# Patient Record
Sex: Female | Born: 1970 | Race: White | Hispanic: No | Marital: Married | Smoking: Current every day smoker
Health system: Southern US, Community
[De-identification: ages and names within clinical notes are randomized; demographics above are authoritative.]

## PROBLEM LIST (undated history)

## (undated) DIAGNOSIS — G4730 Sleep apnea, unspecified: Secondary | ICD-10-CM

## (undated) DIAGNOSIS — G8929 Other chronic pain: Secondary | ICD-10-CM

## (undated) DIAGNOSIS — F319 Bipolar disorder, unspecified: Secondary | ICD-10-CM

## (undated) DIAGNOSIS — M545 Low back pain: Secondary | ICD-10-CM

## (undated) HISTORY — DX: Bipolar disorder, unspecified: F31.9

## (undated) HISTORY — PX: TUBAL LIGATION: SHX77

## (undated) HISTORY — DX: Sleep apnea, unspecified: G47.30

## (undated) HISTORY — DX: Low back pain: M54.5

## (undated) HISTORY — PX: OTHER SURGICAL HISTORY: SHX169

## (undated) HISTORY — PX: WISDOM TOOTH EXTRACTION: SHX21

## (undated) HISTORY — DX: Other chronic pain: G89.29

---

## 2014-03-23 ENCOUNTER — Ambulatory Visit (INDEPENDENT_AMBULATORY_CARE_PROVIDER_SITE_OTHER): Payer: 59 | Admitting: Family Medicine

## 2014-03-23 ENCOUNTER — Encounter: Payer: Self-pay | Admitting: Family Medicine

## 2014-03-23 VITALS — BP 135/91 | HR 89 | Ht 66.0 in | Wt 178.0 lb

## 2014-03-23 DIAGNOSIS — Z1239 Encounter for other screening for malignant neoplasm of breast: Secondary | ICD-10-CM

## 2014-03-23 DIAGNOSIS — Z803 Family history of malignant neoplasm of breast: Secondary | ICD-10-CM

## 2014-03-23 DIAGNOSIS — F329 Major depressive disorder, single episode, unspecified: Secondary | ICD-10-CM

## 2014-03-23 DIAGNOSIS — F319 Bipolar disorder, unspecified: Secondary | ICD-10-CM

## 2014-03-23 DIAGNOSIS — Z131 Encounter for screening for diabetes mellitus: Secondary | ICD-10-CM

## 2014-03-23 DIAGNOSIS — F3289 Other specified depressive episodes: Secondary | ICD-10-CM

## 2014-03-23 DIAGNOSIS — F32A Depression, unspecified: Secondary | ICD-10-CM | POA: Insufficient documentation

## 2014-03-23 HISTORY — DX: Bipolar disorder, unspecified: F31.9

## 2014-03-23 MED ORDER — ESCITALOPRAM OXALATE 10 MG PO TABS: 10.0000 mg | ORAL_TABLET | Freq: Every day | ORAL | Status: DC

## 2014-03-23 MED ORDER — QUETIAPINE FUMARATE ER 200 MG PO TB24: 200.0000 mg | ORAL_TABLET | Freq: Every day | ORAL | Status: DC

## 2014-03-23 NOTE — Progress Notes (Signed)
CC: Aimee Garza is a 43 y.o. female is here for Establish Care   Subjective: HPI:  Pleasant 43 year old here to establish care  Patient reports a history of depression that has been present for almost a decade. She has been on Lexapro for 7 years and states this is been beneficial for depressive symptoms described as irritability, decreased interest in all hobbies, and subjective poor outlook on life. There has been no history of going to harm self or others. Seroquel was added to her regimen 3 years ago and she believes that it was quite helpful at the time however she's feeling new subjective feelings of depression ever since moving here one month ago.  She states that symptoms are mild right now however she wants to know if going up on Seroquel would be of any benefit.  She admits that she does have a history of poor decision making when it comes to finances, before starting Seroquel she would spend money on books and coffee without any hesitation despite accumulating thousands of dollars credit card debt.  She denies any other manic activity.  She reports that both her mother and her grandmother has been diagnosed with breast cancer. Patient has never had a mammogram. She denies any breast complaints or architectural changes in the chest.   Review of Systems - General ROS: negative for - chills, fever, night sweats, weight gain or weight loss Ophthalmic ROS: negative for - decreased vision Psychological ROS: negative for - anxiety or depression other than that described above ENT ROS: negative for - hearing change, nasal congestion, tinnitus or allergies Hematological and Lymphatic ROS: negative for - bleeding problems, bruising or swollen lymph nodes Breast ROS: negative Respiratory ROS: no cough, shortness of breath, or wheezing Cardiovascular ROS: no chest pain or dyspnea on exertion Gastrointestinal ROS: no abdominal pain, change in bowel habits, or black or bloody  stools Genito-Urinary ROS: negative for - genital discharge, genital ulcers, incontinence or abnormal bleeding from genitals Musculoskeletal ROS: negative for - joint pain or muscle pain Neurological ROS: negative for - headaches or memory loss Dermatological ROS: negative for lumps, mole changes, rash and skin lesion changes  Past Medical History  Diagnosis Date  . Bipolar disorder 03/23/2014    History reviewed. No pertinent past surgical history. Family History  Problem Relation Age of Onset  . Alcoholism      grandfather  . Breast cancer Mother   . Diabetes      grandmother  . Hypertension      grandparents    History   Social History  . Marital Status: Married    Spouse Name: N/A    Number of Children: N/A  . Years of Education: N/A   Occupational History  . Not on file.   Social History Main Topics  . Smoking status: Current Every Day Smoker -- 1.00 packs/day for 26 years    Types: Cigarettes  . Smokeless tobacco: Not on file  . Alcohol Use: Yes  . Drug Use: No  . Sexual Activity: Not Currently    Partners: Male   Other Topics Concern  . Not on file   Social History Narrative  . No narrative on file     Objective: BP 135/91  Pulse 89  Ht 5\' 6"  (1.676 m)  Wt 178 lb (80.74 kg)  BMI 28.74 kg/m2  General: Alert and Oriented, No Acute Distress HEENT: Pupils equal, round, reactive to light. Conjunctivae clear.  Moist membranes pharynx unremarkable Lungs: Clear to auscultation bilaterally,  no wheezing/ronchi/rales.  Comfortable work of breathing. Good air movement. Cardiac: Regular rate and rhythm. Normal S1/S2.  No murmurs, rubs, nor gallops.   Extremities: No peripheral edema.  Strong peripheral pulses.  Mental Status: No depression, anxiety, nor agitation. Skin: Warm and dry.  Assessment & Plan: [name] was seen today for establish care.  Diagnoses and associated orders for this visit:  Depression - Discontinue: QUEtiapine (SEROQUEL XR) 200 MG 24  hr tablet; Take 1 tablet (200 mg total) by mouth at bedtime. - TSH - escitalopram (LEXAPRO) 10 MG tablet; Take 1 tablet (10 mg total) by mouth daily. - QUEtiapine (SEROQUEL XR) 200 MG 24 hr tablet; Take 1 tablet (200 mg total) by mouth at bedtime.  Screening for breast cancer - MM DIGITAL SCREENING BILATERAL; Future  Diabetes mellitus screening - COMPLETE METABOLIC PANEL WITH GFR  Bipolar disorder  Family history of breast cancer    Depression: Uncontrolled, Since she has shown some bipolar symptoms in the past will increase Seroquel first, start 200 mg daily if no improvement after one month will go back to 150 and titrate up Lexapro. Checking thyroid function She has already had a lipid screening this year and tells me that it was normal, I've asked her to drop off her values for my records at her convenience. Mammogram has been ordered for family history of breast cancer in this patient over the age of 59 Due for routine diabetic screening    Return in about 4 weeks (around 04/20/2014) for CPE.

## 2014-03-24 ENCOUNTER — Ambulatory Visit (INDEPENDENT_AMBULATORY_CARE_PROVIDER_SITE_OTHER): Payer: 59

## 2014-03-24 DIAGNOSIS — Z1239 Encounter for other screening for malignant neoplasm of breast: Secondary | ICD-10-CM

## 2014-03-24 DIAGNOSIS — Z1231 Encounter for screening mammogram for malignant neoplasm of breast: Secondary | ICD-10-CM

## 2014-03-24 LAB — COMPLETE METABOLIC PANEL WITH GFR
ALK PHOS: 70 U/L (ref 39–117)
ALT: 8 U/L (ref 0–35)
AST: 16 U/L (ref 0–37)
Albumin: 4.6 g/dL (ref 3.5–5.2)
BUN: 8 mg/dL (ref 6–23)
CALCIUM: 10.1 mg/dL (ref 8.4–10.5)
CHLORIDE: 99 meq/L (ref 96–112)
CO2: 28 mEq/L (ref 19–32)
Creat: 0.73 mg/dL (ref 0.50–1.10)
GFR, Est African American: >89 mL/min
GFR, Est Non African American: >89 mL/min
Glucose, Bld: 85 mg/dL (ref 70–99)
POTASSIUM: 4.2 meq/L (ref 3.5–5.3)
Sodium: 135 mEq/L (ref 135–145)
Total Bilirubin: 0.5 mg/dL (ref 0.2–1.2)
Total Protein: 6.9 g/dL (ref 6.0–8.3)

## 2014-03-24 LAB — TSH: TSH: 1.258 u[IU]/mL (ref 0.350–4.500)

## 2014-04-02 ENCOUNTER — Encounter: Payer: Self-pay | Admitting: Family Medicine

## 2014-04-14 ENCOUNTER — Other Ambulatory Visit (HOSPITAL_COMMUNITY)
Admission: RE | Admit: 2014-04-14 | Discharge: 2014-04-14 | Disposition: A | Discharge status: Home / Self Care | Payer: 59 | Source: Ambulatory Visit | Attending: Family Medicine | Admitting: Family Medicine

## 2014-04-14 ENCOUNTER — Encounter: Payer: Self-pay | Admitting: Family Medicine

## 2014-04-14 ENCOUNTER — Ambulatory Visit (INDEPENDENT_AMBULATORY_CARE_PROVIDER_SITE_OTHER): Payer: 59 | Admitting: Family Medicine

## 2014-04-14 VITALS — BP 118/86 | HR 84 | Ht 66.0 in | Wt 178.0 lb

## 2014-04-14 DIAGNOSIS — Z0000 Encounter for general adult medical examination without abnormal findings: Secondary | ICD-10-CM

## 2014-04-14 DIAGNOSIS — D239 Other benign neoplasm of skin, unspecified: Secondary | ICD-10-CM

## 2014-04-14 DIAGNOSIS — Z01419 Encounter for gynecological examination (general) (routine) without abnormal findings: Secondary | ICD-10-CM | POA: Insufficient documentation

## 2014-04-14 DIAGNOSIS — Z113 Encounter for screening for infections with a predominantly sexual mode of transmission: Secondary | ICD-10-CM | POA: Insufficient documentation

## 2014-04-14 DIAGNOSIS — Z124 Encounter for screening for malignant neoplasm of cervix: Secondary | ICD-10-CM

## 2014-04-14 DIAGNOSIS — Z1151 Encounter for screening for human papillomavirus (HPV): Secondary | ICD-10-CM | POA: Insufficient documentation

## 2014-04-14 NOTE — Progress Notes (Signed)
CC: Aimee Garza is a 43 y.o. female is here for Annual Exam   Subjective: HPI:  Colonoscopy: No current indication Papsmear: She believes it has been greater than 5 years and has no history of abnormals Mammogram: Unremarkable last month no history of abnormal  Influenza Vaccine: No current indication Pneumovax: No current indication Td/Tdap: Td given 6 years ago Zoster: (Start 43 yo)  No no acute complaints. No tobacco use, rare alcohol use, no recreational drug use  Review of Systems - General ROS: negative for - chills, fever, night sweats, weight gain or weight loss Ophthalmic ROS: negative for - decreased vision Psychological ROS: negative for - anxiety or depression ENT ROS: negative for - hearing change, nasal congestion, tinnitus or allergies Hematological and Lymphatic ROS: negative for - bleeding problems, bruising or swollen lymph nodes Breast ROS: negative Respiratory ROS: no cough, shortness of breath, or wheezing Cardiovascular ROS: no chest pain or dyspnea on exertion Gastrointestinal ROS: no abdominal pain, change in bowel habits, or black or bloody stools Genito-Urinary ROS: negative for - genital discharge, genital ulcers, incontinence or abnormal bleeding from genitals Musculoskeletal ROS: negative for - joint pain or muscle pain Neurological ROS: negative for - headaches or memory loss Dermatological ROS: negative for lumps, mole changes, rash and skin lesion changes  Past Medical History  Diagnosis Date  . Bipolar disorder 03/23/2014    No past surgical history on file. Family History  Problem Relation Age of Onset  . Alcoholism      grandfather  . Breast cancer Mother   . Diabetes      grandmother  . Hypertension      grandparents    History   Social History  . Marital Status: Married    Spouse Name: N/A    Number of Children: N/A  . Years of Education: N/A   Occupational History  . Not on file.   Social History Main Topics  . Smoking  status: Current Every Day Smoker -- 1.00 packs/day for 26 years    Types: Cigarettes  . Smokeless tobacco: Not on file  . Alcohol Use: Yes  . Drug Use: No  . Sexual Activity: Not Currently    Partners: Male   Other Topics Concern  . Not on file   Social History Narrative  . No narrative on file     Objective: BP 118/86  Pulse 84  Ht 5\' 6"  (1.676 m)  Wt 178 lb (80.74 kg)  BMI 28.74 kg/m2  LMP 02/25/2014  General: No Acute Distress HEENT: Atraumatic, normocephalic, conjunctivae normal without scleral icterus.  No nasal discharge, hearing grossly intact, TMs with good landmarks bilaterally with no middle ear abnormalities, posterior pharynx clear without oral lesions. Neck: Supple, trachea midline, no cervical nor supraclavicular adenopathy. Pulmonary: Clear to auscultation bilaterally without wheezing, rhonchi, nor rales. Cardiac: Regular rate and rhythm.  No murmurs, rubs, nor gallops. No peripheral edema.  2+ peripheral pulses bilaterally. Abdomen: Bowel sounds normal.  No masses.  Non-tender without rebound.  Negative Murphy's sign. GU:  Labia majora & minora without lesions.  Distal urethra unremarkable.  Vaginal wall integrity preserved without mucosal lesions.  Cervix non-tender and without gross lesions nor discharge. MSK: Grossly intact, no signs of weakness.  Full strength throughout upper and lower extremities.  Full ROM in upper and lower extremities.  No midline spinal tenderness. Neuro: Gait unremarkable, CN II-XII grossly intact.  C5-C6 Reflex 2/4 Bilaterally, L4 Reflex 2/4 Bilaterally.  Cerebellar function intact. Skin: No rashes. Dermatofibroma right  thigh medially Psych: Alert and oriented to person/place/time.  Thought process normal. No anxiety/depression.  Assessment & Plan: Dori was seen today for annual exam.  Diagnoses and associated orders for this visit:  Screening for cervical cancer - Cytology - PAP  Annual physical exam - Cancel: Cytology -  PAP  Dermatofibroma    Healthy lifestyle interventions including but not limited to regular exercise, a healthy low fat diet, moderation of salt intake, the dangers of tobacco/alcohol/recreational drug use, nutrition supplementation, and accident avoidance were discussed with the patient and a handout was provided for future reference. Pap smear was obtained and she prefer to have gonorrhea and Chlamydia testing added Discussed benign nature of dermatofibroma  Return in about 6 months (around 10/15/2014) for Mood Follow Up.

## 2014-04-14 NOTE — Patient Instructions (Signed)
"Dermatofibroma" on the right thigh.  Benign skin growth.  Dr. Lajoyce Lauber General Advice Following Your Complete Physical Exam  The Benefits of Regular Exercise: Unless you suffer from an uncontrolled cardiovascular condition, studies strongly suggest that regular exercise and physical activity will add to both the quality and length of your life.  The World Health Organization recommends 150 minutes of moderate intensity aerobic activity every week.  This is best split over 3-4 days a week, and can be as simple as a brisk walk for just over 35 minutes "most days of the week".  This type of exercise has been shown to lower LDL-Cholesterol, lower average blood sugars, lower blood pressure, lower cardiovascular disease risk, improve memory, and increase one's overall sense of wellbeing.  The addition of anaerobic (or "strength training") exercises offers additional benefits including but not limited to increased metabolism, prevention of osteoporosis, and improved overall cholesterol levels.  How Can I Strive For A Low-Fat Diet?: Current guidelines recommend that 25-35 percent of your daily energy (food) intake should come from fats.  One might ask how can this be achieved without having to dissect each meal on a daily basis?  Switch to skim or 1% milk instead of whole milk.  Focus on lean meats such as ground [place], fresh fish, baked chicken, and lean cuts of beef as your source of dietary protein.  Limit saturated fat consumption to less than 10% of your daily caloric intake.  Limit trans fatty acid consumption primarily by limiting synthetic trans fats such as partially hydrogenated oils (Ex: fried fast foods).  Substitute olive or vegetable oil for solid fats where possible.  Moderation of Salt Intake: Provided you don't carry a diagnosis of congestive heart failure nor renal failure, I recommend a daily allowance of no more than 2300 mg of salt (sodium).  Keeping under this daily goal is  associated with a decreased risk of cardiovascular events, creeping above it can lead to elevated blood pressures and increases your risk of cardiovascular events.  Milligrams (mg) of salt is listed on all nutrition labels, and your daily intake can add up faster than you think.  Most canned and frozen dinners can pack in over half your daily salt allowance in one meal.    Lifestyle Health Risks: Certain lifestyle choices carry specific health risks.  As you may already know, tobacco use has been associated with increasing one's risk of cardiovascular disease, pulmonary disease, numerous cancers, among many other issues.  What you may not know is that there are medications and nicotine replacement strategies that can more than double your chances of successfully quitting.  I would be thrilled to help manage your quitting strategy if you currently use tobacco products.  When it comes to alcohol use, I've yet to find an "ideal" daily allowance.  Provided an individual does not have a medical condition that is exacerbated by alcohol consumption, general guidelines determine "safe drinking" as no more than two standard drinks for a man or no more than one standard drink for a female per day.  However, much debate still exists on whether any amount of alcohol consumption is technically "safe".  My general advice, keep alcohol consumption to a minimum for general health promotion.  If you or others believe that alcohol, tobacco, or recreational drug use is interfering with your life, I would be happy to provide confidential counseling regarding treatment options.  General "Over The Counter" Nutrition Advice: Postmenopausal women should aim for a daily calcium intake of 1200  mg, however a significant portion of this might already be provided by diets including milk, yogurt, cheese, and other dairy products.  Vitamin D has been shown to help preserve bone density, prevent fatigue, and has even been shown to help reduce  falls in the elderly.  Ensuring a daily intake of 800 Units of Vitamin D is a good place to start to enjoy the above benefits, we can easily check your Vitamin D level to see if you'd potentially benefit from supplementation beyond 800 Units a day.  Folic Acid intake should be of particular concern to women of childbearing age.  Daily consumption of 025-427 mcg of Folic Acid is recommended to minimize the chance of spinal cord defects in a fetus should pregnancy occur.    For many adults, accidents still remain one of the most common culprits when it comes to cause of death.  Some of the simplest but most effective preventitive habits you can adopt include regular seatbelt use, proper helmet use, securing firearms, and regularly testing your smoke and carbon monoxide detectors.  Rani Idler B. [facility] [place], [place], [place] [address] Phone: [phone]

## 2014-08-01 ENCOUNTER — Other Ambulatory Visit: Payer: Self-pay | Admitting: Family Medicine

## 2014-08-02 NOTE — Telephone Encounter (Signed)
Left message asking patient to confirm the dose of the Seroquel. Is she taking 200 mg's or 150 mg's.

## 2014-08-27 ENCOUNTER — Other Ambulatory Visit: Payer: Self-pay | Admitting: *Deleted

## 2014-08-27 ENCOUNTER — Other Ambulatory Visit: Payer: Self-pay

## 2014-08-27 DIAGNOSIS — F32A Depression, unspecified: Secondary | ICD-10-CM

## 2014-08-27 DIAGNOSIS — F329 Major depressive disorder, single episode, unspecified: Secondary | ICD-10-CM

## 2014-08-27 MED ORDER — ESCITALOPRAM OXALATE 10 MG PO TABS: 10.0000 mg | ORAL_TABLET | Freq: Every day | ORAL | Status: DC

## 2014-08-27 MED ORDER — QUETIAPINE FUMARATE ER 200 MG PO TB24: 200.0000 mg | ORAL_TABLET | Freq: Every day | ORAL | Status: DC

## 2014-09-28 ENCOUNTER — Other Ambulatory Visit: Payer: Self-pay | Admitting: Family Medicine

## 2014-10-23 ENCOUNTER — Other Ambulatory Visit: Payer: Self-pay | Admitting: Family Medicine

## 2014-10-25 ENCOUNTER — Other Ambulatory Visit: Payer: Self-pay | Admitting: Family Medicine

## 2014-11-11 ENCOUNTER — Other Ambulatory Visit: Payer: Self-pay | Admitting: Family Medicine

## 2014-11-25 ENCOUNTER — Other Ambulatory Visit: Payer: Self-pay | Admitting: Family Medicine

## 2014-12-07 ENCOUNTER — Ambulatory Visit (INDEPENDENT_AMBULATORY_CARE_PROVIDER_SITE_OTHER): Payer: 59 | Admitting: Family Medicine

## 2014-12-07 ENCOUNTER — Encounter: Payer: Self-pay | Admitting: Family Medicine

## 2014-12-07 VITALS — BP 123/78 | HR 83 | Wt 184.0 lb

## 2014-12-07 DIAGNOSIS — N946 Dysmenorrhea, unspecified: Secondary | ICD-10-CM

## 2014-12-07 DIAGNOSIS — F329 Major depressive disorder, single episode, unspecified: Secondary | ICD-10-CM

## 2014-12-07 DIAGNOSIS — M545 Low back pain, unspecified: Secondary | ICD-10-CM

## 2014-12-07 DIAGNOSIS — F32A Depression, unspecified: Secondary | ICD-10-CM

## 2014-12-07 DIAGNOSIS — F3176 Bipolar disorder, in full remission, most recent episode depressed: Secondary | ICD-10-CM

## 2014-12-07 MED ORDER — CYCLOBENZAPRINE HCL 10 MG PO TABS: ORAL_TABLET | ORAL | Status: DC

## 2014-12-07 MED ORDER — QUETIAPINE FUMARATE ER 200 MG PO TB24: ORAL_TABLET | ORAL | Status: DC

## 2014-12-07 MED ORDER — ESCITALOPRAM OXALATE 10 MG PO TABS: ORAL_TABLET | ORAL | Status: DC

## 2014-12-07 NOTE — Progress Notes (Addendum)
CC: Aimee Garza is a 44 y.o. female is here for depression f/u   Subjective: HPI:  Follow-up depression: Continues to take Lexapro 10 mg daily, she ran out of Seroquel last week. Since I saw her last she denies any depression but has experienced irritability since running out of Seroquel. She denies any known side effects. She denies any other mental disturbance nor thoughts of wanting to harm herself or others. She denies anxiety, hallucinations, paranoia, impulsivity or any manic-like behavior.  Complains of right low back pain that has been present for the past 3 weeks on a daily basis. 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 It is described as mild in severity. Worse with certain twisting movements or side bending to the left. It is nonradiating. Slightly improved with muscle relaxers no benefit from ibuprofen or Tylenol.  She wants know she should have her estrogen levels checked. She has this because she wants know how close she is to menopause. Upon further questioning she tells me that her menstrual cycles are predictable however the last 7 days and are described as severe in severity with respect to pain. She's been wanting to know if she should get a hysterectomy.   Review Of Systems Outlined In HPI  Past Medical History  Diagnosis Date  . Bipolar disorder 03/23/2014    No past surgical history on file. Family History  Problem Relation Age of Onset  . Alcoholism      grandfather  . Breast cancer Mother   . Diabetes      grandmother  . Hypertension      grandparents    History   Social History  . Marital Status: Married    Spouse Name: N/A    Number of Children: N/A  . Years of Education: N/A   Occupational History  . Not on file.   Social History Main Topics  . Smoking status: Current Every Day Smoker -- 1.00 packs/day for 26 years    Types: Cigarettes  . Smokeless tobacco: Not on file  . Alcohol Use: Yes  . Drug Use: No  . Sexual  Activity:    Partners: Male   Other Topics Concern  . Not on file   Social History Narrative     Objective: BP 123/78 mmHg  Pulse 83  Wt 184 lb (83.462 kg)  Vital signs reviewed. General: Alert and Oriented, No Acute Distress HEENT: Pupils equal, round, reactive to light. Conjunctivae clear.  External ears unremarkable.  Moist mucous membranes. Lungs: Clear and comfortable work of breathing, speaking in full sentences without accessory muscle use. Cardiac: Regular rate and rhythm.  Back: No midline spinous process tenderness in the lumbar region. Pain is reproduced with paraspinal musculature palpation on the right, no palpable mass. Full range of motion and strength in both lower extremities Neuro: CN II-XII grossly intact, gait normal. Extremities: No peripheral edema.  Strong peripheral pulses.  Mental Status: No depression, anxiety, nor agitation. Logical though process. Skin: Warm and dry.  Assessment & Plan: [name] was seen today for depression f/u.  Diagnoses and associated orders for this visit:  Bipolar disorder, in full remission, most recent episode depressed - QUEtiapine (SEROQUEL XR) 200 MG 24 hr tablet; TAKE 1 TABLET BY MOUTH AT BEDTIME. - escitalopram (LEXAPRO) 10 MG tablet; TAKE 1 TABLET (10 MG TOTAL) BY MOUTH DAILY.  Depression - QUEtiapine (SEROQUEL XR) 200 MG 24 hr tablet; TAKE 1 TABLET BY MOUTH AT BEDTIME. - escitalopram (LEXAPRO) 10 MG tablet;  TAKE 1 TABLET (10 MG TOTAL) BY MOUTH DAILY.  Dysmenorrhea - Ambulatory referral to Gynecology  Right-sided low back pain without sciatica - cyclobenzaprine (FLEXERIL) 10 MG tablet; Take a half to a full tab every 8-12 hours only as needed for muscle spasm or back pain, may cause sedation.    Bipolar disorder with depression: Currently controlled, irritability is only due to running out of Seroquel. Continue former regimen of Seroquel and Lexapro. She was given a handout of rehabilitative exercises and  stretches to do at home to help with her muscle strain causing her back pain. Flexeril as needed for discomfort. Referral to gynecology has been placed to look into her candidacy for hysterectomy, she tells me she is not interested in any hormonal approaches to controlling her dysmenorrhea   Return in about 6 months (around 06/07/2015).

## 2014-12-10 ENCOUNTER — Other Ambulatory Visit: Payer: Self-pay | Admitting: Family Medicine

## 2014-12-14 ENCOUNTER — Telehealth: Payer: Self-pay

## 2014-12-14 NOTE — Telephone Encounter (Signed)
Left message for pt to call office to schedule appt. Received referral from Dr. Ileene Rubens.

## 2015-03-01 ENCOUNTER — Ambulatory Visit (INDEPENDENT_AMBULATORY_CARE_PROVIDER_SITE_OTHER): Payer: 59 | Admitting: Family Medicine

## 2015-03-01 ENCOUNTER — Encounter: Payer: Self-pay | Admitting: Family Medicine

## 2015-03-01 VITALS — BP 121/84 | HR 82 | Wt 184.0 lb

## 2015-03-01 DIAGNOSIS — R609 Edema, unspecified: Secondary | ICD-10-CM | POA: Diagnosis not present

## 2015-03-01 DIAGNOSIS — Z13220 Encounter for screening for lipoid disorders: Secondary | ICD-10-CM | POA: Diagnosis not present

## 2015-03-01 DIAGNOSIS — J329 Chronic sinusitis, unspecified: Secondary | ICD-10-CM

## 2015-03-01 DIAGNOSIS — M5416 Radiculopathy, lumbar region: Secondary | ICD-10-CM | POA: Diagnosis not present

## 2015-03-01 DIAGNOSIS — B9689 Other specified bacterial agents as the cause of diseases classified elsewhere: Secondary | ICD-10-CM

## 2015-03-01 DIAGNOSIS — A499 Bacterial infection, unspecified: Secondary | ICD-10-CM

## 2015-03-01 LAB — COMPLETE METABOLIC PANEL WITH GFR
ALK PHOS: 73 U/L (ref 39–117)
ALT: 11 U/L (ref 0–35)
AST: 17 U/L (ref 0–37)
Albumin: 4.1 g/dL (ref 3.5–5.2)
BILIRUBIN TOTAL: 0.2 mg/dL (ref 0.2–1.2)
BUN: 12 mg/dL (ref 6–23)
CHLORIDE: 106 meq/L (ref 96–112)
CO2: 23 mEq/L (ref 19–32)
Calcium: 8.7 mg/dL (ref 8.4–10.5)
Creat: 0.75 mg/dL (ref 0.50–1.10)
GFR, Est African American: >89 mL/min
Glucose, Bld: 82 mg/dL (ref 70–99)
POTASSIUM: 4.8 meq/L (ref 3.5–5.3)
SODIUM: 138 meq/L (ref 135–145)
Total Protein: 6.3 g/dL (ref 6.0–8.3)

## 2015-03-01 LAB — LIPID PANEL
Cholesterol: 172 mg/dL (ref 0–200)
HDL: 56 mg/dL (ref >=46)
LDL Cholesterol: 97 mg/dL (ref 0–99)
Total CHOL/HDL Ratio: 3.1 Ratio
Triglycerides: 96 mg/dL (ref <150)
VLDL: 19 mg/dL (ref 0–40)

## 2015-03-01 LAB — TSH: TSH: 1.062 u[IU]/mL (ref 0.350–4.500)

## 2015-03-01 MED ORDER — TRAMADOL HCL 50 MG PO TABS: 50.0000 mg | ORAL_TABLET | Freq: Three times a day (TID) | ORAL | Status: DC | PRN

## 2015-03-01 MED ORDER — PREDNISONE 20 MG PO TABS: ORAL_TABLET | ORAL | Status: AC

## 2015-03-01 NOTE — Progress Notes (Signed)
CC: Aimee Garza is a 44 y.o. female is here for Back Pain   Subjective: HPI:  Complains of persistent right low back pain that radiates into the anterior right thigh symptoms are mild-to-moderate in severity. Improved with mobility and muscle relaxers to mild degree, also has benefited one dose of Percocet however only for a few hours. Denies any other motor or sensory disturbances in the lower back or lower extremities. 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 Pain slightly improved with bending forward. Slightly worse with bending backwards.  No gastrointestinal orGenitourinary complaints  Complains of facial pressure localized to the forehead with nasal congestion and postnasal drip. Nonproductive cough. Fatigue that all has been present for the past 7 days. Those are moderate in severity and persistent. Nothing seems to make it better or worse. No interventions as of yet. No fevers, chills, sore throat, shortness of breath or bloody sputum.  Complains of bilateral swelling of the ankles that has been present for the past few months. Off-and-on mild in severity. Nothing seems to make it better or worse. She denies any orthopnea nor edema else she admits that she enjoys salty foods and drinks and does not moderate her sodium intake.     Review Of Systems Outlined In HPI  Past Medical History  Diagnosis Date  . Bipolar disorder 03/23/2014    No past surgical history on file. Family History  Problem Relation Age of Onset  . Alcoholism      grandfather  . Breast cancer Mother   . Diabetes      grandmother  . Hypertension      grandparents    History   Social History  . Marital Status: Married    Spouse Name: N/A  . Number of Children: N/A  . Years of Education: N/A   Occupational History  . Not on file.   Social History Main Topics  . Smoking status: Current Every Day Smoker -- 1.00 packs/day for 26 years     Types: Cigarettes  . Smokeless tobacco: Not on file  . Alcohol Use: Yes  . Drug Use: No  . Sexual Activity:    Partners: Male   Other Topics Concern  . Not on file   Social History Narrative     Objective: BP 121/84 mmHg  Pulse 82  Wt 184 lb (83.462 kg)  General: Alert and Oriented, No Acute Distress HEENT: Pupils equal, round, reactive to light. Conjunctivae clear.  External ears unremarkable, canals clear with intact TMs with appropriate landmarks.  Middle ear appears open without effusion. Pink inferior turbinates.  Moist mucous membranes, pharynx without inflammation nor lesions.  Neck supple without palpable lymphadenopathy nor abnormal masses. Lungs: Clear to auscultation bilaterally, no wheezing/ronchi/rales.  Comfortable work of breathing. Good air movement. Back: No midline spinous process tenderness in the lumbar region to palpation. No paraspinal musculature tenderness to palpation. Full range of motion and strength in both lower extremities. Extremities: No peripheral edema.  Strong peripheral pulses.  Mental Status: No depression, anxiety, nor agitation. Skin: Warm and dry.no overlying skin changes of site of her discomfort  Assessment & Plan: Adream was seen today for back pain.  Diagnoses and all orders for this visit:  Lumbar radiculitis Orders: -     predniSONE (DELTASONE) 20 MG tablet; Three tabs daily days 1-3, two tabs daily days 4-6, one tab daily days 7-9, half tab daily days 10-13. -  traMADol (ULTRAM) 50 MG tablet; Take 1 tablet (50 mg total) by mouth every 8 (eight) hours as needed.  Bacterial sinusitis Orders: -     predniSONE (DELTASONE) 20 MG tablet; Three tabs daily days 1-3, two tabs daily days 4-6, one tab daily days 7-9, half tab daily days 10-13.  Edema Orders: -     COMPLETE METABOLIC PANEL WITH GFR -     TSH  Lipid screening Orders: -     Lipid panel   Suspicion of lumbar radiculitis therefore start prednisone as therapeutic  and diagnostic approach to her discomfort. Encouraged her not to use Percocet due to risk of tolerance and rather use tramadol instead. If no better in 1 week call me and we'll refer to Dr. Darene Lamer for second opinion on management Edema: Discussed this is most likely due to her sodium intake. She would like her kidney function checked which is reasonable. Also checking thyroid to rule out hypothyroidism.She would like a lipid panel checked since she's getting blood work done and has been over a year since this was checked last Bacterial sinusitis: Hopeful that her prednisone will relieve her from her sinusitis issues, if no better by Wednesday call me and I'll be happy to provide her with an antibiotic.  Return if symptoms worsen or fail to improve.

## 2015-03-04 ENCOUNTER — Other Ambulatory Visit: Payer: Self-pay | Admitting: Family Medicine

## 2015-03-04 DIAGNOSIS — F329 Major depressive disorder, single episode, unspecified: Secondary | ICD-10-CM

## 2015-03-04 DIAGNOSIS — F3176 Bipolar disorder, in full remission, most recent episode depressed: Secondary | ICD-10-CM

## 2015-03-04 DIAGNOSIS — F32A Depression, unspecified: Secondary | ICD-10-CM

## 2015-03-04 MED ORDER — QUETIAPINE FUMARATE ER 200 MG PO TB24: ORAL_TABLET | ORAL | Status: DC

## 2015-03-04 MED ORDER — ESCITALOPRAM OXALATE 10 MG PO TABS: ORAL_TABLET | ORAL | Status: DC

## 2015-06-05 ENCOUNTER — Other Ambulatory Visit: Payer: Self-pay | Admitting: Family Medicine

## 2015-06-11 ENCOUNTER — Ambulatory Visit: Payer: 59 | Admitting: Family Medicine

## 2015-06-15 ENCOUNTER — Encounter: Payer: Self-pay | Admitting: Family Medicine

## 2015-06-15 ENCOUNTER — Other Ambulatory Visit: Payer: Self-pay | Admitting: Family Medicine

## 2015-06-15 ENCOUNTER — Ambulatory Visit (INDEPENDENT_AMBULATORY_CARE_PROVIDER_SITE_OTHER): Payer: 59 | Admitting: Family Medicine

## 2015-06-15 VITALS — BP 132/93 | HR 95 | Ht 66.0 in | Wt 181.0 lb

## 2015-06-15 DIAGNOSIS — F3176 Bipolar disorder, in full remission, most recent episode depressed: Secondary | ICD-10-CM | POA: Diagnosis not present

## 2015-06-15 DIAGNOSIS — M545 Low back pain, unspecified: Secondary | ICD-10-CM

## 2015-06-15 DIAGNOSIS — F17200 Nicotine dependence, unspecified, uncomplicated: Secondary | ICD-10-CM

## 2015-06-15 DIAGNOSIS — F329 Major depressive disorder, single episode, unspecified: Secondary | ICD-10-CM

## 2015-06-15 DIAGNOSIS — H8113 Benign paroxysmal vertigo, bilateral: Secondary | ICD-10-CM

## 2015-06-15 DIAGNOSIS — Z720 Tobacco use: Secondary | ICD-10-CM

## 2015-06-15 DIAGNOSIS — M5416 Radiculopathy, lumbar region: Secondary | ICD-10-CM

## 2015-06-15 DIAGNOSIS — Z1231 Encounter for screening mammogram for malignant neoplasm of breast: Secondary | ICD-10-CM

## 2015-06-15 DIAGNOSIS — F32A Depression, unspecified: Secondary | ICD-10-CM

## 2015-06-15 MED ORDER — QUETIAPINE FUMARATE ER 200 MG PO TB24: ORAL_TABLET | ORAL | Status: DC

## 2015-06-15 MED ORDER — CYCLOBENZAPRINE HCL 10 MG PO TABS: ORAL_TABLET | ORAL | Status: DC

## 2015-06-15 MED ORDER — ESCITALOPRAM OXALATE 10 MG PO TABS
ORAL_TABLET | ORAL | Status: DC
Start: 2015-06-15 — End: 2015-12-05

## 2015-06-15 MED ORDER — VARENICLINE TARTRATE 1 MG PO TABS: 1.0000 mg | ORAL_TABLET | Freq: Two times a day (BID) | ORAL | Status: DC

## 2015-06-15 MED ORDER — TRAMADOL HCL 50 MG PO TABS
50.0000 mg | ORAL_TABLET | Freq: Three times a day (TID) | ORAL | Status: DC | PRN
Start: 2015-06-15 — End: 2015-07-20

## 2015-06-15 MED ORDER — VARENICLINE TARTRATE 0.5 MG X 11 & 1 MG X 42 PO MISC: ORAL | Status: DC

## 2015-06-15 NOTE — Progress Notes (Signed)
CC: Aimee Garza is a 44 y.o. female is here for Depression   Subjective: HPI:  Bipolar disorder: She is requesting refills on Seroquel and Lexapro. She states that she feels these medications are still doing a great job of preventing any depression or mood swings. She denies any known side effects.  She states that she is running out of tramadol and cyclobenzaprine that she usually uses for her right low back pain. The radicular component to her pain has resolved ever since she took prednisone when I saw her last. She still has what feels like a knot in the right lower back just posterior to the pelvic brim. It's nonradiating and worse with sitting for more than a few minutes. She denies any motor or sensory disturbances and lower extremities. She denies any new midline pain.   She tells me that she would like to try quitting smoking with Chantix. She had mild success with this in the past. She denies any known side effects will taking this medication. She's had trouble quitting cold [place] no other interventions as of yet.  She complains of dizziness that has been occurring on an almost daily that can occur anytime of the day in any random situation. It happened at rest, while driving, and while doing the dishes. It is described as a sudden sensation of dizziness that lasts a few seconds and subsides on its own without any intervention. She's never had this before. She denies any other motor or sensory disturbances. She denies headache, head trauma, nausea, vomiting nor visual disturbance. No changes to medications that she knows of recently. Denies orthostatic symptoms. Denies hearing loss or tinnitus   Review Of Systems Outlined In HPI  Past Medical History  Diagnosis Date  . Bipolar disorder 03/23/2014    No past surgical history on file. Family History  Problem Relation Age of Onset  . Alcoholism      grandfather  . Breast cancer Mother   . Diabetes      grandmother  . Hypertension       grandparents    History   Social History  . Marital Status: Married    Spouse Name: N/A  . Number of Children: N/A  . Years of Education: N/A   Occupational History  . Not on file.   Social History Main Topics  . Smoking status: Current Every Day Smoker -- 1.00 packs/day for 26 years    Types: Cigarettes  . Smokeless tobacco: Not on file  . Alcohol Use: Yes  . Drug Use: No  . Sexual Activity:    Partners: Male   Other Topics Concern  . Not on file   Social History Narrative     Objective: BP 132/93 mmHg  Pulse 95  Ht 5\' 6"  (1.676 m)  Wt 181 lb (82.101 kg)  BMI 29.23 kg/m2  General: Alert and Oriented, No Acute Distress HEENT: Pupils equal, round, reactive to light. Conjunctivae clear.  External ears unremarkable, canals clear with intact TMs with appropriate landmarks.  Middle ear appears open without effusion. Pink inferior turbinates.  Moist mucous membranes, pharynx without inflammation nor lesions.  Neck supple without palpable lymphadenopathy nor abnormal masses. Lungs: Clear to auscultation bilaterally, no wheezing/ronchi/rales.  Comfortable work of breathing. Good air movement. Cardiac: Regular rate and rhythm. Normal S1/S2.  No murmurs, rubs, nor gallops.   Neuro: Cranial nerves II through XII grossly intact Back: No midline spinous process tenderness in the lumbar region. Her pain is reproduced with palpation of the  paraspinal musculature on the right low lumbar region. Extremities: No peripheral edema.  Strong peripheral pulses.  Mental Status: No depression, anxiety, nor agitation. Skin: Warm and dry.  Assessment & Plan: Aimee Garza was seen today for depression.  Diagnoses and all orders for this visit:  Bipolar disorder, in full remission, most recent episode depressed Orders: -     escitalopram (LEXAPRO) 10 MG tablet; TAKE 1 TABLET (10 MG TOTAL) BY MOUTH DAILY. -     QUEtiapine (SEROQUEL XR) 200 MG 24 hr tablet; TAKE 1 TABLET BY MOUTH AT  BEDTIME.  Right-sided low back pain without sciatica Orders: -     cyclobenzaprine (FLEXERIL) 10 MG tablet; Take a half to a full tab every 8-12 hours only as needed for muscle spasm or back pain, may cause sedation.  Tobacco use disorder Orders: -     varenicline (CHANTIX STARTING MONTH PAK) 0.5 MG X 11 & 1 MG X 42 tablet; Take one 0.5 mg tablet by mouth once daily for 3 days, then increase to one 0.5 mg tablet twice daily for 4 days, then increase to one 1 mg tablet twice daily. -     varenicline (CHANTIX CONTINUING MONTH PAK) 1 MG tablet; Take 1 tablet (1 mg total) by mouth 2 (two) times daily.  Depression Orders: -     escitalopram (LEXAPRO) 10 MG tablet; TAKE 1 TABLET (10 MG TOTAL) BY MOUTH DAILY. -     QUEtiapine (SEROQUEL XR) 200 MG 24 hr tablet; TAKE 1 TABLET BY MOUTH AT BEDTIME.  Lumbar radiculitis Orders: -     traMADol (ULTRAM) 50 MG tablet; Take 1 tablet (50 mg total) by mouth every 8 (eight) hours as needed.  BPV (benign positional vertigo), bilateral   Bipolar disorder with depression: Controlled continue Seroquel and Lexapro.  Tobacco use disorder: Restart Chantix, discussed starting 1 week prior to a quit date Lumbar radiculitis: Radicular component is resolved, now just below back pain likely muscular strain, may continue cyclobenzaprine and as needed tramadol BPV: Discussed the modified Epley maneuver and how to do this at home 3 times a day for the next week and if no better will consider neuroimaging.  Return in about 6 months (around 12/16/2015).

## 2015-06-23 ENCOUNTER — Ambulatory Visit (INDEPENDENT_AMBULATORY_CARE_PROVIDER_SITE_OTHER): Payer: 59

## 2015-06-23 DIAGNOSIS — Z1231 Encounter for screening mammogram for malignant neoplasm of breast: Secondary | ICD-10-CM

## 2015-06-27 ENCOUNTER — Other Ambulatory Visit: Payer: Self-pay | Admitting: Family Medicine

## 2015-07-19 ENCOUNTER — Other Ambulatory Visit: Payer: Self-pay | Admitting: Family Medicine

## 2015-07-20 ENCOUNTER — Other Ambulatory Visit: Payer: Self-pay | Admitting: Family Medicine

## 2015-07-20 ENCOUNTER — Ambulatory Visit (INDEPENDENT_AMBULATORY_CARE_PROVIDER_SITE_OTHER): Payer: 59 | Admitting: Family Medicine

## 2015-07-20 ENCOUNTER — Encounter: Payer: Self-pay | Admitting: Family Medicine

## 2015-07-20 VITALS — BP 138/94 | HR 98 | Temp 98.4°F | Wt 182.0 lb

## 2015-07-20 DIAGNOSIS — M5416 Radiculopathy, lumbar region: Secondary | ICD-10-CM

## 2015-07-20 DIAGNOSIS — J00 Acute nasopharyngitis [common cold]: Secondary | ICD-10-CM | POA: Diagnosis not present

## 2015-07-20 DIAGNOSIS — M545 Low back pain, unspecified: Secondary | ICD-10-CM

## 2015-07-20 DIAGNOSIS — J208 Acute bronchitis due to other specified organisms: Principal | ICD-10-CM

## 2015-07-20 DIAGNOSIS — B9689 Other specified bacterial agents as the cause of diseases classified elsewhere: Secondary | ICD-10-CM

## 2015-07-20 MED ORDER — CYCLOBENZAPRINE HCL 10 MG PO TABS: ORAL_TABLET | ORAL | Status: DC

## 2015-07-20 MED ORDER — AZITHROMYCIN 250 MG PO TABS: ORAL_TABLET | ORAL | Status: AC

## 2015-07-20 MED ORDER — TRAMADOL HCL 50 MG PO TABS
50.0000 mg | ORAL_TABLET | Freq: Three times a day (TID) | ORAL | Status: DC | PRN
Start: 2015-07-20 — End: 2015-08-26
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
## 2015-08-24 ENCOUNTER — Other Ambulatory Visit: Payer: Self-pay | Admitting: Family Medicine

## 2015-08-25 ENCOUNTER — Other Ambulatory Visit: Payer: Self-pay | Admitting: Family Medicine

## 2015-08-25 DIAGNOSIS — M5416 Radiculopathy, lumbar region: Secondary | ICD-10-CM

## 2015-08-26 ENCOUNTER — Other Ambulatory Visit: Payer: Self-pay | Admitting: Family Medicine

## 2015-08-26 MED ORDER — TRAMADOL HCL 50 MG PO TABS: 50.0000 mg | ORAL_TABLET | Freq: Three times a day (TID) | ORAL | Status: DC | PRN

## 2015-09-18 ENCOUNTER — Other Ambulatory Visit: Payer: Self-pay | Admitting: Sports Medicine

## 2015-09-20 ENCOUNTER — Other Ambulatory Visit: Payer: Self-pay | Admitting: Sports Medicine

## 2015-09-20 DIAGNOSIS — M5416 Radiculopathy, lumbar region: Secondary | ICD-10-CM

## 2015-09-21 ENCOUNTER — Other Ambulatory Visit: Payer: Self-pay | Admitting: Sports Medicine

## 2015-09-21 MED ORDER — TRAMADOL HCL 50 MG PO TABS
50.0000 mg | ORAL_TABLET | Freq: Three times a day (TID) | ORAL | Status: DC | PRN
Start: 2015-09-21 — End: 2015-09-21

## 2015-09-21 NOTE — Telephone Encounter (Signed)
Aimee Garza, Rx placed in in-box ready for pickup/faxing.  

## 2015-11-22 ENCOUNTER — Other Ambulatory Visit: Payer: Self-pay | Admitting: Family Medicine

## 2015-12-04 ENCOUNTER — Other Ambulatory Visit: Payer: Self-pay | Admitting: Family Medicine

## 2015-12-05 ENCOUNTER — Other Ambulatory Visit: Payer: Self-pay | Admitting: Family Medicine

## 2015-12-15 ENCOUNTER — Ambulatory Visit: Payer: 59 | Admitting: Family Medicine

## 2015-12-21 ENCOUNTER — Ambulatory Visit (INDEPENDENT_AMBULATORY_CARE_PROVIDER_SITE_OTHER): Payer: 59 | Admitting: Family Medicine

## 2015-12-21 ENCOUNTER — Encounter: Payer: Self-pay | Admitting: Family Medicine

## 2015-12-21 ENCOUNTER — Ambulatory Visit (INDEPENDENT_AMBULATORY_CARE_PROVIDER_SITE_OTHER): Payer: 59

## 2015-12-21 VITALS — BP 123/88 | HR 93 | Wt 184.0 lb

## 2015-12-21 DIAGNOSIS — F3176 Bipolar disorder, in full remission, most recent episode depressed: Secondary | ICD-10-CM

## 2015-12-21 DIAGNOSIS — M545 Low back pain, unspecified: Secondary | ICD-10-CM

## 2015-12-21 DIAGNOSIS — Z23 Encounter for immunization: Secondary | ICD-10-CM | POA: Diagnosis not present

## 2015-12-21 MED ORDER — TOPIRAMATE 50 MG PO TABS: 50.0000 mg | ORAL_TABLET | Freq: Two times a day (BID) | ORAL | Status: DC

## 2015-12-21 MED ORDER — ESCITALOPRAM OXALATE 10 MG PO TABS: ORAL_TABLET | ORAL | Status: DC

## 2015-12-21 MED ORDER — QUETIAPINE FUMARATE ER 200 MG PO TB24: 200.0000 mg | ORAL_TABLET | Freq: Every day | ORAL | Status: DC

## 2015-12-21 NOTE — Progress Notes (Signed)
CC: Aimee Garza is a 45 y.o. female is here for Follow-up   Subjective: HPI:   follow-up bipolar disorder: She tells me she feels great,she's happy with her job and is enjoying life. There were 2 days when the seasons change from fall to winter where she felt somewhat depressed however she doesn't this happens every year around this time of year and only last 2 days and then out of nowhere to go away. No thoughts or himself or others. No anxiety or any other mental disturbance. There's been no manic behavior since I saw her last.  She tells me her low back pain has been worsening since I saw her last. 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 Symptoms are in the right lower back and radiated down the entire leg. It's a throbbing sensation with a shooting sensation that goes down all the way into the foot. It improved with cyclobenzaprine and tramadol however only mildly. It's present on a moderate degree every day when she gets home from work. She denies any other motor or sensory disturbances in the lower extremities. She denies any recent trauma. Denies any gastrointestinal complaints or genitourinary complaints   Review Of Systems Outlined In HPI  Past Medical History  Diagnosis Date  . Bipolar disorder ([place]) 03/23/2014    No past surgical history on file. Family History  Problem Relation Age of Onset  . Alcoholism      grandfather  . Breast cancer Mother   . Diabetes      grandmother  . Hypertension      grandparents    Social History   Social History  . Marital Status: Married    Spouse Name: N/A  . Number of Children: N/A  . Years of Education: N/A   Occupational History  . Not on file.   Social History Main Topics  . Smoking status: Current Every Day Smoker -- 1.00 packs/day for 26 years    Types: Cigarettes  . Smokeless tobacco: Not on file  . Alcohol Use: Yes  . Drug Use: No  . Sexual  Activity:    Partners: Male   Other Topics Concern  . Not on file   Social History Narrative     Objective: BP 123/88 mmHg  Pulse 93  Wt 184 lb (83.462 kg)  Vital signs reviewed. General: Alert and Oriented, No Acute Distress HEENT: Pupils equal, round, reactive to light. Conjunctivae clear.  External ears unremarkable.  Moist mucous membranes. Lungs: Clear and comfortable work of breathing, speaking in full sentences without accessory muscle use. Cardiac: Regular rate and rhythm.  Neuro: CN II-XII grossly intact, gait normal. Extremities: No peripheral edema.  Strong peripheral pulses. Full range of motion and strength in both lower extreme venous.  Mental Status: No depression, anxiety, nor agitation. Logical though process. Skin: Warm and dry.  Assessment & Plan: Krishana was seen today for follow-up.  Diagnoses and all orders for this visit:  Bipolar disorder, in full remission, most recent episode depressed ([place]) -     escitalopram (LEXAPRO) 10 MG tablet; TAKE 1 TABLET (10 MG TOTAL) BY MOUTH DAILY. -     QUEtiapine (SEROQUEL XR) 200 MG 24 hr tablet; Take 1 tablet (200 mg total) by mouth at bedtime.  Right-sided low back pain without sciatica -     DG Lumbar Spine Complete; Future -     topiramate (TOPAMAX) 50 MG tablet;  Take 1 tablet (50 mg total) by mouth 2 (two) times daily.   Bipolar disorder: Controlled with Seroquel and Lexapro Right-sided low back pain with a element of radiculitis. Obtaining x-rays to rule out compression fracture, if normal will consider this nerve impingement and will offer gabapentin.  Return in about 6 months (around 06/19/2016) for Mood.

## 2015-12-22 ENCOUNTER — Telehealth: Payer: Self-pay | Admitting: Family Medicine

## 2015-12-22 MED ORDER — GABAPENTIN 100 MG PO CAPS: 100.0000 mg | ORAL_CAPSULE | Freq: Three times a day (TID) | ORAL | Status: DC | PRN

## 2015-12-22 NOTE — Telephone Encounter (Signed)
Detailed message left for pt with instructions

## 2015-12-22 NOTE — Telephone Encounter (Signed)
Will you please let patient know that her back xray was normal and reassuring.  To address her pinched nerve pain I'd recommend trying a medication called gabapentin that I've sent to her pharmacy.  Please contact me if no better in one week.

## 2016-01-19 ENCOUNTER — Other Ambulatory Visit: Payer: Self-pay | Admitting: Family Medicine

## 2016-02-03 ENCOUNTER — Other Ambulatory Visit: Payer: Self-pay | Admitting: Family Medicine

## 2016-02-09 ENCOUNTER — Ambulatory Visit (INDEPENDENT_AMBULATORY_CARE_PROVIDER_SITE_OTHER): Payer: 59 | Admitting: Family Medicine

## 2016-02-09 ENCOUNTER — Encounter: Payer: Self-pay | Admitting: Family Medicine

## 2016-02-09 VITALS — BP 129/89 | HR 101 | Temp 98.7°F | Wt 174.0 lb

## 2016-02-09 DIAGNOSIS — A499 Bacterial infection, unspecified: Secondary | ICD-10-CM

## 2016-02-09 DIAGNOSIS — J329 Chronic sinusitis, unspecified: Secondary | ICD-10-CM

## 2016-02-09 DIAGNOSIS — B9689 Other specified bacterial agents as the cause of diseases classified elsewhere: Secondary | ICD-10-CM

## 2016-02-09 MED ORDER — AMOXICILLIN-POT CLAVULANATE 500-125 MG PO TABS: ORAL_TABLET | ORAL | Status: AC

## 2016-02-09 MED ORDER — HYDROCODONE-HOMATROPINE 5-1.5 MG/5ML PO SYRP: 5.0000 mL | ORAL_SOLUTION | Freq: Three times a day (TID) | ORAL | Status: DC | PRN

## 2016-02-09 NOTE — Progress Notes (Signed)
CC: Aimee Garza is a 45 y.o. female is here for Sinusitis   Subjective: HPI:  Facial pressure in the cheeks with nasal congestion and postnasal drip has been present for 2-3 weeks now. The past 2 days it's been causing a nonproductive cough and fatigue. She is getting no benefit from Advil Cold and Sinus nor NyQuil. Symptoms are waking her up in the middle of the night despite elevating the head of her bed. She denies fevers, chills, shortness of breath, wheezing however cough is described as extreme. She denies nausea, confusion or rash   Review Of Systems Outlined In HPI  Past Medical History  Diagnosis Date  . Bipolar disorder ([place]) 03/23/2014    No past surgical history on file. Family History  Problem Relation Age of Onset  . Alcoholism      grandfather  . Breast cancer Mother   . Diabetes      grandmother  . Hypertension      grandparents    Social History   Social History  . Marital Status: Married    Spouse Name: N/A  . Number of Children: N/A  . Years of Education: N/A   Occupational History  . Not on file.   Social History Main Topics  . Smoking status: Current Every Day Smoker -- 1.00 packs/day for 26 years    Types: Cigarettes  . Smokeless tobacco: Not on file  . Alcohol Use: Yes  . Drug Use: No  . Sexual Activity:    Partners: Male   Other Topics Concern  . Not on file   Social History Narrative     Objective: BP 129/89 mmHg  Pulse 101  Temp(Src) 98.7 F (37.1 C) (Oral)  Wt 174 lb (78.926 kg)  SpO2 97%  General: Alert and Oriented, No Acute Distress HEENT: Pupils equal, round, reactive to light. Conjunctivae clear.  External ears unremarkable, canals clear with intact TMs with appropriate landmarks.  Middle ear appears open without effusion. Pink inferior turbinates.  Moist mucous membranes, pharynx without inflammation nor lesions Other than postnasal drip .  Neck supple without palpable lymphadenopathy nor abnormal masses. Lungs: Clear to  auscultation bilaterally, no wheezing/ronchi/rales.  Comfortable work of breathing. Good air movement.Frequent coughing  Extremities: No peripheral edema.  Strong peripheral pulses.  Mental Status: No depression, anxiety, nor agitation. Skin: Warm and dry.  Assessment & Plan: Nakesia was seen today for sinusitis.  Diagnoses and all orders for this visit:  Bacterial sinusitis -     HYDROcodone-homatropine (HYCODAN) 5-1.5 MG/5ML syrup; Take 5 mLs by mouth every 8 (eight) hours as needed for cough. -     amoxicillin-clavulanate (AUGMENTIN) 500-125 MG tablet; Take one by mouth every 8 hours for ten total days.   Bacterial  sinusitis: Start Augmentin consider nasal saline washes and may start sparing use of Hycodan.  No Follow-up on file.

## 2016-02-16 ENCOUNTER — Other Ambulatory Visit: Payer: Self-pay | Admitting: Family Medicine

## 2016-02-16 ENCOUNTER — Telehealth: Payer: Self-pay | Admitting: Family Medicine

## 2016-02-16 DIAGNOSIS — B9689 Other specified bacterial agents as the cause of diseases classified elsewhere: Secondary | ICD-10-CM

## 2016-02-16 DIAGNOSIS — J329 Chronic sinusitis, unspecified: Principal | ICD-10-CM

## 2016-02-16 MED ORDER — HYDROCODONE-HOMATROPINE 5-1.5 MG/5ML PO SYRP
5.0000 mL | ORAL_SOLUTION | Freq: Three times a day (TID) | ORAL | Status: DC | PRN
Start: 2016-02-16 — End: 2016-06-13

## 2016-02-16 MED ORDER — PREDNISONE 20 MG PO TABS
ORAL_TABLET | ORAL | Status: AC
Start: 2016-02-16 — End: 2016-02-21

## 2016-02-16 NOTE — Telephone Encounter (Signed)
Rx for prednisone has been sent.

## 2016-02-16 NOTE — Telephone Encounter (Signed)
Aimee Garza, Rx placed in in-box ready for pickup/faxing. Will you please let her know that the only additional medication i know that would help with her cough would be prednisone, i'm happy to send this in if she's willing to put up with the potential side effects.

## 2016-02-16 NOTE — Addendum Note (Signed)
Addended by: Marcial Pacas on: 02/16/2016 02:59 PM   Modules accepted: Orders

## 2016-02-16 NOTE — Telephone Encounter (Signed)
Pt.notified

## 2016-02-16 NOTE — Telephone Encounter (Signed)
Pt is willing to take prednisone. Send to [facility] in [place].

## 2016-02-16 NOTE — Telephone Encounter (Signed)
Vm left for pt explaining that she has a new Rx ready for pick-up and advised to call back to let us know if she is willing to take prednisone or not.

## 2016-03-21 ENCOUNTER — Ambulatory Visit (INDEPENDENT_AMBULATORY_CARE_PROVIDER_SITE_OTHER): Payer: 59 | Admitting: Family Medicine

## 2016-03-21 ENCOUNTER — Other Ambulatory Visit: Payer: Self-pay | Admitting: Family Medicine

## 2016-03-21 ENCOUNTER — Encounter: Payer: Self-pay | Admitting: Family Medicine

## 2016-03-21 VITALS — BP 126/90 | HR 102 | Wt 172.0 lb

## 2016-03-21 DIAGNOSIS — Z23 Encounter for immunization: Secondary | ICD-10-CM

## 2016-03-21 DIAGNOSIS — M545 Low back pain, unspecified: Secondary | ICD-10-CM

## 2016-03-21 DIAGNOSIS — Z0000 Encounter for general adult medical examination without abnormal findings: Secondary | ICD-10-CM

## 2016-03-21 MED ORDER — TRAMADOL HCL 50 MG PO TABS: ORAL_TABLET | ORAL | Status: DC
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
## 2016-03-21 NOTE — Addendum Note (Signed)
Addended by: Delrae Alfred on: 03/21/2016 01:36 PM   Modules accepted: Orders, SmartSet

## 2016-03-22 ENCOUNTER — Telehealth: Payer: Self-pay

## 2016-03-22 DIAGNOSIS — Z0000 Encounter for general adult medical examination without abnormal findings: Secondary | ICD-10-CM

## 2016-03-22 LAB — COMPLETE METABOLIC PANEL WITH GFR
ALBUMIN: 4.5 g/dL (ref 3.6–5.1)
ALK PHOS: 88 U/L (ref 33–115)
ALT: 9 U/L (ref 6–29)
AST: 17 U/L (ref 10–30)
BILIRUBIN TOTAL: 0.4 mg/dL (ref 0.2–1.2)
BUN: 13 mg/dL (ref 7–25)
CO2: 27 mmol/L (ref 20–31)
CREATININE: 0.72 mg/dL (ref 0.50–1.10)
Calcium: 9.1 mg/dL (ref 8.6–10.2)
Chloride: 102 mmol/L (ref 98–110)
GFR, Est Non African American: >89 mL/min (ref >=60)
Glucose, Bld: 69 mg/dL (ref 65–99)
Potassium: 4.2 mmol/L (ref 3.5–5.3)
Sodium: 140 mmol/L (ref 135–146)
TOTAL PROTEIN: 6.8 g/dL (ref 6.1–8.1)

## 2016-03-22 LAB — LIPID PANEL
Cholesterol: 180 mg/dL (ref 125–200)
HDL: 63 mg/dL (ref >=46)
LDL CALC: 95 mg/dL (ref <130)
TRIGLYCERIDES: 112 mg/dL (ref <150)
Total CHOL/HDL Ratio: 2.9 Ratio (ref <=5.0)
VLDL: 22 mg/dL (ref <30)

## 2016-03-22 LAB — CBC
HEMATOCRIT: 43.4 % (ref 35.0–45.0)
Hemoglobin: 14.3 g/dL (ref 11.7–15.5)
MCH: 31 pg (ref 27.0–33.0)
MCHC: 32.9 g/dL (ref 32.0–36.0)
MCV: 93.9 fL (ref 80.0–100.0)
MPV: 10.6 fL (ref 7.5–12.5)
PLATELETS: 297 10*3/uL (ref 140–400)
RBC: 4.62 MIL/uL (ref 3.80–5.10)
RDW: 13.6 % (ref 11.0–15.0)
WBC: 7.9 10*3/uL (ref 3.8–10.8)

## 2016-03-22 LAB — TSH: TSH: 1.73 mIU/L

## 2016-03-22 LAB — VITAMIN D 25 HYDROXY (VIT D DEFICIENCY, FRACTURES): Vit D, 25-Hydroxy: 30 ng/mL (ref 30–100)

## 2016-03-22 NOTE — Telephone Encounter (Signed)
Lab added

## 2016-03-22 NOTE — Telephone Encounter (Signed)
Pt asked could a estrogen test be added to her labs. Are you ok with this?

## 2016-03-22 NOTE — Telephone Encounter (Signed)
I'm ok with this, lab slip in your in box if needed. (I let her know yesterday that it's unlikely her insurance will cover this)

## 2016-03-26 LAB — ESTROGENS, TOTAL: Estrogen: 621.4 pg/mL — ABNORMAL HIGH

## 2016-03-29 ENCOUNTER — Ambulatory Visit: Payer: 59

## 2016-04-16 ENCOUNTER — Other Ambulatory Visit: Payer: Self-pay | Admitting: Family Medicine

## 2016-04-21 ENCOUNTER — Other Ambulatory Visit: Payer: Self-pay | Admitting: Family Medicine

## 2016-05-09 ENCOUNTER — Other Ambulatory Visit: Payer: Self-pay

## 2016-05-09 MED ORDER — GABAPENTIN 100 MG PO CAPS: ORAL_CAPSULE | ORAL | Status: DC

## 2016-05-10 ENCOUNTER — Other Ambulatory Visit: Payer: Self-pay | Admitting: Family Medicine

## 2016-05-21 ENCOUNTER — Other Ambulatory Visit: Payer: Self-pay | Admitting: Family Medicine

## 2016-05-23 ENCOUNTER — Telehealth: Payer: Self-pay | Admitting: Family Medicine

## 2016-05-23 DIAGNOSIS — F17200 Nicotine dependence, unspecified, uncomplicated: Secondary | ICD-10-CM

## 2016-05-23 MED ORDER — VARENICLINE TARTRATE 1 MG PO TABS: 1.0000 mg | ORAL_TABLET | Freq: Two times a day (BID) | ORAL | Status: DC

## 2016-05-23 NOTE — Telephone Encounter (Signed)
Refill req 

## 2016-06-13 ENCOUNTER — Ambulatory Visit (INDEPENDENT_AMBULATORY_CARE_PROVIDER_SITE_OTHER): Payer: 59 | Admitting: Family Medicine

## 2016-06-13 ENCOUNTER — Encounter: Payer: Self-pay | Admitting: Family Medicine

## 2016-06-13 ENCOUNTER — Other Ambulatory Visit: Payer: Self-pay

## 2016-06-13 VITALS — BP 120/85 | HR 111 | Wt 173.0 lb

## 2016-06-13 DIAGNOSIS — L237 Allergic contact dermatitis due to plants, except food: Secondary | ICD-10-CM | POA: Diagnosis not present

## 2016-06-13 DIAGNOSIS — F32A Depression, unspecified: Secondary | ICD-10-CM

## 2016-06-13 DIAGNOSIS — L089 Local infection of the skin and subcutaneous tissue, unspecified: Secondary | ICD-10-CM | POA: Diagnosis not present

## 2016-06-13 DIAGNOSIS — F329 Major depressive disorder, single episode, unspecified: Secondary | ICD-10-CM

## 2016-06-13 DIAGNOSIS — F3176 Bipolar disorder, in full remission, most recent episode depressed: Secondary | ICD-10-CM

## 2016-06-13 DIAGNOSIS — L918 Other hypertrophic disorders of the skin: Secondary | ICD-10-CM

## 2016-06-13 MED ORDER — TOPIRAMATE 50 MG PO TABS: ORAL_TABLET | ORAL | Status: DC

## 2016-06-13 MED ORDER — PREDNISONE 20 MG PO TABS: ORAL_TABLET | ORAL | Status: AC

## 2016-06-13 MED ORDER — GABAPENTIN 100 MG PO CAPS: ORAL_CAPSULE | ORAL | Status: DC

## 2016-06-13 MED ORDER — ESCITALOPRAM OXALATE 10 MG PO TABS: ORAL_TABLET | ORAL | Status: DC

## 2016-06-13 MED ORDER — QUETIAPINE FUMARATE ER 200 MG PO TB24: 200.0000 mg | ORAL_TABLET | Freq: Every day | ORAL | Status: DC

## 2016-06-13 MED ORDER — TRAMADOL HCL 50 MG PO TABS: 50.0000 mg | ORAL_TABLET | Freq: Four times a day (QID) | ORAL | Status: DC | PRN

## 2016-06-13 NOTE — Progress Notes (Signed)
CC: Aimee Garza is a 45 y.o. female is here for skin tags and questions   Subjective: HPI:  Follow depression: She feels like this may have gotten worse over the past week since her husband was diagnosed with a mass in the neck which is likely lymphoma or squamous cell carcinoma. She feels that she is having crying episodes which are uncontrollable and can happen at any moment. She denies any thoughts or not himself or others. She still taking Seroquel and Lexapro. She denies any anxiety or any other mental disturbance. She is constantly thinking and worrying about her husband. She denies any manic episodes.  She believes she's gotten into some poison ivy on the back of her leg. It's itchy but not painful. No interventions as of yet. She describes it as a bumpy red rash. No fevers, chills or swollen lymph nodes.  She is a skin tag underneath her right arm which is becoming more painful on a daily basis. It's worse whenever wearing a bra or a shirt. It gets worse when friction is applied to it. It's underneath her right arm. And slowly enlarging. She denies any other skin changes  Review Of Systems Outlined In HPI  Past Medical History  Diagnosis Date  . Bipolar disorder ([place]) 03/23/2014    No past surgical history on file. Family History  Problem Relation Age of Onset  . Alcoholism      grandfather  . Breast cancer Mother   . Diabetes      grandmother  . Hypertension      grandparents    Social History   Social History  . Marital Status: Married    Spouse Name: N/A  . Number of Children: N/A  . Years of Education: N/A   Occupational History  . Not on file.   Social History Main Topics  . Smoking status: Current Every Day Smoker -- 1.00 packs/day for 26 years    Types: Cigarettes  . Smokeless tobacco: Not on file  . Alcohol Use: Yes  . Drug Use: No  . Sexual Activity:    Partners: Male   Other Topics Concern  . Not on file   Social History Narrative      Objective: BP 120/85 mmHg  Pulse 111  Wt 173 lb (78.472 kg)  General: Alert and Oriented, No Acute Distress HEENT: Pupils equal, round, reactive to light. Conjunctivae clear. Moist mucous membranes Lungs: Clear to auscultation bilaterally, no wheezing/ronchi/rales.  Comfortable work of breathing. Good air movement. Cardiac: Regular rate and rhythm. Normal S1/S2.  No murmurs, rubs, nor gallops.   Extremities: No peripheral edema.  Strong peripheral pulses.  Mental Status: Mild depression with no anxiety, nor agitation. Skin: Warm and dry. Inflamed skin tag in the right axillary region. Painless yellow crusted vesicles on the back of the right calf  Assessment & Plan: Aimee Garza was seen today for skin tags and questions.  Diagnoses and all orders for this visit:  Depression  Poison ivy -     predniSONE (DELTASONE) 20 MG tablet; Three tabs daily days 1-3, two tabs daily days 4-6, one tab daily days 7-9, half tab daily days 10-13.  Bipolar disorder, in full remission, most recent episode depressed (HCC) -     escitalopram (LEXAPRO) 10 MG tablet; TAKE 1 TABLET (10 MG TOTAL) BY MOUTH DAILY. -     QUEtiapine (SEROQUEL XR) 200 MG 24 hr tablet; Take 1 tablet (200 mg total) by mouth at bedtime.  Inflamed skin tag  Other  orders -     gabapentin (NEURONTIN) 100 MG capsule; TAKE ONE CAPSULE BY MOUTH 3 TIMES A DAY AS NEEDED FOR PAIN -     traMADol (ULTRAM) 50 MG tablet; Take 1 tablet (50 mg total) by mouth every 6 (six) hours as needed. -     topiramate (TOPAMAX) 50 MG tablet; TAKE 1 TABLET (50 MG TOTAL) BY MOUTH 2 (TWO) TIMES DAILY.   Depression: Uncontrolled chronic condition increasing Lexapro continue Seroquel. Inflamed skin tag: She would like to have this removed with cryotherapy today. Poison ivy: Start prednisone taper call for any side effects. She is requesting a refill on her usual Neurontin tramadol and Topamax which seems reasonable.  Cryotherapy Procedure  Note  Pre-operative Diagnosis: inflamed skin tag  Post-operative Diagnosis: same  Locations:  Right axillary region  Indications: pain  Anesthesia: none Procedure Details  History of allergy to iodine: no. Pacemaker? no.  Patient informed of risks (permanent scarring, infection, light or dark discoloration, bleeding, infection, weakness, numbness and recurrence of the lesion) and benefits of the procedure and verbal informed consent obtained.  The areas are treated with liquid nitrogen therapy, frozen until ice ball extended 2 mm beyond lesion, allowed to thaw, and treated again. The patient tolerated procedure well.  The patient was instructed on post-op care, warned that there may be blister formation, redness and pain. Recommend OTC analgesia as needed for pain.  Condition: Stable  Complications: none.  Plan: 1. Instructed to keep the area dry and covered for 24-48h and clean thereafter. 2. Warning signs of infection were reviewed.   3. Recommended that the patient use OTC analgesics as needed for pain.  4. Return PRN   Return in about 6 months (around 12/14/2016).

## 2016-06-28 ENCOUNTER — Ambulatory Visit: Payer: 59

## 2016-07-26 ENCOUNTER — Ambulatory Visit (INDEPENDENT_AMBULATORY_CARE_PROVIDER_SITE_OTHER): Payer: 59

## 2016-07-26 DIAGNOSIS — Z1231 Encounter for screening mammogram for malignant neoplasm of breast: Secondary | ICD-10-CM | POA: Diagnosis not present

## 2016-07-26 DIAGNOSIS — Z0000 Encounter for general adult medical examination without abnormal findings: Secondary | ICD-10-CM

## 2016-08-02 ENCOUNTER — Other Ambulatory Visit: Payer: Self-pay | Admitting: Family Medicine

## 2016-08-16 ENCOUNTER — Other Ambulatory Visit: Payer: Self-pay | Admitting: Family Medicine

## 2016-08-25 ENCOUNTER — Other Ambulatory Visit: Payer: Self-pay | Admitting: Family Medicine

## 2016-10-13 ENCOUNTER — Other Ambulatory Visit: Payer: Self-pay | Admitting: Sports Medicine

## 2016-10-14 ENCOUNTER — Other Ambulatory Visit: Payer: Self-pay | Admitting: Sports Medicine

## 2016-10-16 MED ORDER — TRAMADOL HCL 50 MG PO TABS: 50.0000 mg | ORAL_TABLET | Freq: Four times a day (QID) | ORAL | 0 refills | Status: DC | PRN

## 2016-10-23 ENCOUNTER — Other Ambulatory Visit: Payer: Self-pay | Admitting: *Deleted

## 2016-10-23 MED ORDER — CYCLOBENZAPRINE HCL 10 MG PO TABS: ORAL_TABLET | ORAL | 0 refills | Status: DC

## 2016-11-03 ENCOUNTER — Encounter: Payer: Self-pay | Admitting: *Deleted

## 2016-11-03 ENCOUNTER — Emergency Department
Admission: EM | Admit: 2016-11-03 | Discharge: 2016-11-03 | Disposition: A | Discharge status: Home / Self Care | Payer: 59 | Source: Home / Self Care | Attending: Family Medicine | Admitting: Family Medicine

## 2016-11-03 DIAGNOSIS — J040 Acute laryngitis: Secondary | ICD-10-CM

## 2016-11-03 DIAGNOSIS — J069 Acute upper respiratory infection, unspecified: Secondary | ICD-10-CM

## 2016-11-03 LAB — POCT RAPID STREP A (OFFICE): Rapid Strep A Screen: NEGATIVE

## 2016-11-03 MED ORDER — BENZONATATE 100 MG PO CAPS: 100.0000 mg | ORAL_CAPSULE | Freq: Three times a day (TID) | ORAL | 0 refills | Status: DC

## 2016-11-03 MED ORDER — GUAIFENESIN 100 MG/5ML PO LIQD: 400.0000 mg | ORAL | 0 refills | Status: DC | PRN

## 2016-11-03 MED ORDER — SALINE SPRAY 0.65 % NA SOLN: 1.0000 | NASAL | 0 refills | Status: DC | PRN

## 2016-11-03 NOTE — ED Triage Notes (Signed)
Pt awoke this AM with sore throat, HA and body aches. Afebrile.

## 2016-11-03 NOTE — ED Provider Notes (Signed)
CSN: [id number]     Arrival date & time 11/03/16  1149 History   First MD Initiated Contact with Patient 11/03/16 1208     Chief Complaint  Patient presents with  . Sore Throat  . Generalized Body Aches   (Consider location/radiation/quality/duration/timing/severity/associated sxs/prior Treatment) HPI  Aimee Garza is a 45 y.o. female presenting to UC with c/o sudden onset waking this morning with sore throat, nasal congestion, and body aches. Pt also reports having a hoarse voice.  She has been taking allergy medication and OTC cold medication with Tylenol this morning. No known fever. She did have some nausea this morning but thought it was due to not eating much due to not feeling well. Denies vomiting or diarrhea. 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 She did not receive the flu vaccine this season.     Past Medical History:  Diagnosis Date  . Bipolar disorder ([place]) 03/23/2014   History reviewed. No pertinent surgical history. Family History  Problem Relation Age of Onset  . Alcoholism      grandfather  . Breast cancer Mother   . Diabetes      grandmother  . Hypertension      grandparents   Social History  Substance Use Topics  . Smoking status: Current Every Day Smoker    Packs/day: 1.00    Years: 26.00    Types: Cigarettes  . Smokeless tobacco: Never Used  . Alcohol use Yes   OB History    No data available     Review of Systems  Constitutional: Positive for fatigue. Negative for chills and fever.  HENT: Positive for congestion and sore throat. Negative for ear pain, trouble swallowing and voice change.   Respiratory: Positive for cough. Negative for shortness of breath.   Cardiovascular: Negative for chest pain and palpitations.  Gastrointestinal: Positive for nausea. Negative for abdominal pain, diarrhea and vomiting.  Musculoskeletal: Positive for arthralgias and  myalgias. Negative for back pain.  Skin: Negative for rash.  Neurological: Positive for headaches. Negative for light-headedness.    Allergies  Patient has no known allergies.  Home Medications   Prior to Admission medications   Medication Sig Start Date End Date Taking? Authorizing Provider  benzonatate (TESSALON) 100 MG capsule Take 1-2 capsules (100-200 mg total) by mouth every 8 (eight) hours. 11/03/16   Noland Fordyce, PA-C  cyclobenzaprine (FLEXERIL) 10 MG tablet TAKE 1/2 TO 1 TABLET BY MOUTH EVERY 8 TO 12 HOURS AS NEEDED FOR MUSCLE SPASMS Needs f/u 10/23/16   Hali Marry, MD  escitalopram (LEXAPRO) 10 MG tablet TAKE 1 TABLET (10 MG TOTAL) BY MOUTH DAILY. 06/13/16   Sean Hommel, DO  gabapentin (NEURONTIN) 100 MG capsule TAKE ONE CAPSULE BY MOUTH 3 TIMES A DAY AS NEEDED FOR PAIN 06/13/16   Marcial Pacas, DO  guaiFENesin (ROBITUSSIN) 100 MG/5ML liquid Take 20 mLs (400 mg total) by mouth every 4 (four) hours as needed for cough. 11/03/16   Noland Fordyce, PA-C  QUEtiapine (SEROQUEL XR) 200 MG 24 hr tablet Take 1 tablet (200 mg total) by mouth at bedtime. 06/13/16   Marcial Pacas, DO  sodium chloride (OCEAN) 0.65 % SOLN nasal spray Place 1 spray into both nostrils as needed. 11/03/16   Noland Fordyce, PA-C  topiramate (TOPAMAX) 50 MG tablet TAKE 1 TABLET (50 MG TOTAL) BY MOUTH 2 (TWO) TIMES DAILY. 08/25/16   Emeterio Reeve, DO  traMADol (  ULTRAM) 50 MG tablet Take 1 tablet (50 mg total) by mouth every 6 (six) hours as needed. for pain 10/16/16   Hali Marry, MD  varenicline (CHANTIX CONTINUING MONTH PAK) 1 MG tablet Take 1 tablet (1 mg total) by mouth 2 (two) times daily. 05/23/16   Marcial Pacas, DO   Meds Ordered and Administered this Visit  Medications - No data to display  BP 121/88 (BP Location: Left Arm)   Pulse 111   Temp 98.2 F (36.8 C) (Oral)   Wt 169 lb (76.7 kg)   LMP 09/28/2016   SpO2 96%   BMI 27.28 kg/m  No data found.   Physical Exam  Constitutional: She  appears well-developed and well-nourished. No distress.  HENT:  Head: Normocephalic and atraumatic.  Right Ear: Tympanic membrane normal.  Left Ear: Tympanic membrane normal.  Nose: Mucosal edema present. Right sinus exhibits no maxillary sinus tenderness and no frontal sinus tenderness. Left sinus exhibits no maxillary sinus tenderness and no frontal sinus tenderness.  Mouth/Throat: Uvula is midline and mucous membranes are normal. Posterior oropharyngeal erythema present. No oropharyngeal exudate, posterior oropharyngeal edema or tonsillar abscesses.  Eyes: Conjunctivae are normal. No scleral icterus.  Neck: Normal range of motion. Neck supple.  Hoarse voice but no stridor.  Cardiovascular: Normal rate, regular rhythm and normal heart sounds.   Pulmonary/Chest: Effort normal and breath sounds normal. No stridor. No respiratory distress. She has no wheezes. She has no rales.  Abdominal: Soft. She exhibits no distension. There is no tenderness.  Musculoskeletal: Normal range of motion.  Lymphadenopathy:    She has no cervical adenopathy.  Neurological: She is alert.  Skin: Skin is warm and dry. She is not diaphoretic.  Nursing note and vitals reviewed.   Urgent Care Course   Clinical Course     Procedures (including critical care time)  Labs Review Labs Reviewed  POCT RAPID STREP A (OFFICE)  POCT INFLUENZA A/B    Imaging Review No results found.    MDM   1. Laryngitis   2. Viral upper respiratory illness    Pt c/o sudden onset flu-like symptoms that started this morning. 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  Rapid strep and flu: Negative   No evidence of bacterial infection at this time. Symptoms likely viral. Encouraged symptomatic treatment.  Rx: Tessalon, nasal saline spray, and guaifenesin    Noland Fordyce, PA-C 11/03/16 1552

## 2016-11-11 ENCOUNTER — Other Ambulatory Visit: Payer: Self-pay | Admitting: Family Medicine

## 2016-11-22 ENCOUNTER — Other Ambulatory Visit: Payer: Self-pay | Admitting: Osteopathic Medicine

## 2016-11-25 ENCOUNTER — Other Ambulatory Visit: Payer: Self-pay | Admitting: Family Medicine

## 2016-12-02 ENCOUNTER — Other Ambulatory Visit: Payer: Self-pay | Admitting: Family Medicine

## 2016-12-10 ENCOUNTER — Other Ambulatory Visit: Payer: Self-pay | Admitting: Family Medicine

## 2016-12-19 ENCOUNTER — Ambulatory Visit (INDEPENDENT_AMBULATORY_CARE_PROVIDER_SITE_OTHER): Payer: 59 | Admitting: Family Medicine

## 2016-12-19 ENCOUNTER — Encounter: Payer: Self-pay | Admitting: Family Medicine

## 2016-12-19 VITALS — BP 132/86 | HR 98 | Wt 172.0 lb

## 2016-12-19 DIAGNOSIS — F17200 Nicotine dependence, unspecified, uncomplicated: Secondary | ICD-10-CM | POA: Diagnosis not present

## 2016-12-19 DIAGNOSIS — B370 Candidal stomatitis: Secondary | ICD-10-CM

## 2016-12-19 DIAGNOSIS — G894 Chronic pain syndrome: Secondary | ICD-10-CM | POA: Diagnosis not present

## 2016-12-19 DIAGNOSIS — F3176 Bipolar disorder, in full remission, most recent episode depressed: Secondary | ICD-10-CM

## 2016-12-19 DIAGNOSIS — Z23 Encounter for immunization: Secondary | ICD-10-CM | POA: Diagnosis not present

## 2016-12-19 DIAGNOSIS — G8929 Other chronic pain: Secondary | ICD-10-CM

## 2016-12-19 DIAGNOSIS — M5441 Lumbago with sciatica, right side: Secondary | ICD-10-CM

## 2016-12-19 MED ORDER — QUETIAPINE FUMARATE ER 200 MG PO TB24: 200.0000 mg | ORAL_TABLET | Freq: Every day | ORAL | 1 refills | Status: DC

## 2016-12-19 MED ORDER — TOPIRAMATE 50 MG PO TABS: 50.0000 mg | ORAL_TABLET | Freq: Two times a day (BID) | ORAL | 1 refills | Status: DC

## 2016-12-19 MED ORDER — ESCITALOPRAM OXALATE 10 MG PO TABS: 10.0000 mg | ORAL_TABLET | Freq: Every day | ORAL | 1 refills | Status: DC

## 2016-12-19 MED ORDER — CYCLOBENZAPRINE HCL 10 MG PO TABS: 5.0000 mg | ORAL_TABLET | Freq: Three times a day (TID) | ORAL | 1 refills | Status: DC | PRN

## 2016-12-19 MED ORDER — FLUCONAZOLE 100 MG PO TABS: 100.0000 mg | ORAL_TABLET | Freq: Every day | ORAL | 1 refills | Status: DC

## 2016-12-19 MED ORDER — TRAMADOL HCL 50 MG PO TABS: 50.0000 mg | ORAL_TABLET | Freq: Four times a day (QID) | ORAL | 5 refills | Status: DC | PRN

## 2016-12-19 NOTE — Progress Notes (Signed)
Aimee Garza is a 46 y.o. female who presents to [facility] today for establish care. Patient's previous primary care provider has left the practice. This is the patient's first visit with me. She is doing well overall.  Bipolar: Patient has well-controlled bipolar disorder with the below medications. She feels as though her life is reasonably well controlled. She denies any manic or depression symptoms.  Back pain: Patient has chronic back pain. She takes between 30 and 60 tramadol per month. She's been doing this for some time. She notes her back pain tends to get worse with lifting. 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 She occasionally notes right radiating leg pain. She notes this is mild and intermittent. She occasionally uses gabapentin which does help some. She denies any bowel bladder dysfunction weakness or numbness.  Thrush: Patient notes continued oral thrush intermittent.  She notes her husband has had thrush in the past. She notes a burning pain with occasional white plaque in her mouth. She notes this is been ongoing now for a few weeks. She has not tried any treatment yet.   Past Medical History:  Diagnosis Date  . Bipolar disorder ([place]) 03/23/2014   No past surgical history on file. Social History  Substance Use Topics  . Smoking status: Current Every Day Smoker    Packs/day: 1.00    Years: 26.00    Types: Cigarettes  . Smokeless tobacco: Never Used  . Alcohol use Yes   family history includes Breast cancer in her mother.  ROS as above:  Medications: Current Outpatient Prescriptions  Medication Sig Dispense Refill  . cyclobenzaprine (FLEXERIL) 10 MG tablet Take 0.5-1 tablets (5-10 mg total) by mouth 3 (three) times daily as needed for muscle spasms. 90 tablet 1  . escitalopram (LEXAPRO) 10 MG tablet Take 1 tablet (10 mg total) by  mouth daily. 90 tablet 1  . fluconazole (DIFLUCAN) 100 MG tablet Take 1 tablet (100 mg total) by mouth daily. 7 tablet 1  . gabapentin (NEURONTIN) 100 MG capsule TAKE ONE CAPSULE BY MOUTH 3 TIMES A DAY AS NEEDED FOR PAIN 90 capsule 2  . QUEtiapine (SEROQUEL XR) 200 MG 24 hr tablet Take 1 tablet (200 mg total) by mouth at bedtime. 90 tablet 1  . topiramate (TOPAMAX) 50 MG tablet Take 1 tablet (50 mg total) by mouth 2 (two) times daily. 180 tablet 1  . traMADol (ULTRAM) 50 MG tablet Take 1 tablet (50 mg total) by mouth every 6 (six) hours as needed. for pain 45 tablet 5   No current facility-administered medications for this visit.    No Known Allergies  Health Maintenance Health Maintenance  Topic Date Due  . HIV Screening  12/02/1987  . INFLUENZA VACCINE  07/04/2016  . PAP SMEAR  04/14/2017  . TETANUS/TDAP  03/21/2026     Exam:  BP 132/86   Pulse 98   Wt 172 lb (78 kg)   BMI 27.76 kg/m  Gen: Well NAD HEENT: EOMI,  MMM White plaque with red base on tongue. Slightly irritated appearing. Lungs: Normal work of breathing. CTABL Heart: RRR no MRG Abd: NABS, Soft. Nondistended, Nontender Exts: Brisk capillary refill, warm and well perfused.  MSK: Nontender to spinal midline. Tender palpation right SI joint. Normal back motion. Lower extremity strength is equal and normal throughout. Normal gait. Psych: Alert and oriented normal speech thought process and affect.  Depression screen [facility] 2/9 12/19/2016  Decreased Interest 0  Down, Depressed, Hopeless 0  PHQ - 2 Score 0  Altered sleeping 0  Tired, decreased energy 1  Change in appetite 1  Feeling bad or failure about yourself  0  Trouble concentrating 0  Moving slowly or fidgety/restless 0  Suicidal thoughts 0  PHQ-9 Score 2   GAD 7 : Generalized Anxiety Score 12/19/2016  Nervous, Anxious, on Edge 0  Control/stop worrying 0  Worry too much - different things 1  Trouble relaxing 0  Restless 1  Easily annoyed or irritable 1    Afraid - awful might happen 0  Total GAD 7 Score 3      No results found for this or any previous visit (from the past 72 hour(s)). No results found.    Assessment and Plan: 46 y.o. female with  Bipolar disorder: Patient seems to be doing pretty well now. No plan to change anything in the near future. Refill current medications.  Chronic back pain: Patient seems to be doing pretty well with her current regimen. I reviewed her in the [place] controlled substance database we have also printed and signed a controlled use agreement and will do a urine drug screen as well. We'll recheck this issue every 3-6 months or sooner if needed. Additionally for back pain will refer to physical therapy as I think she could have some improvement as well.  Oral thrush: Likely based on exam. Treat with fluconazole.   Orders Placed This Encounter  Procedures  . Pain Mgmt, Profile 6 Conf w/o mM, U  . Ambulatory referral to Physical Therapy    Referral Priority:   Routine    Referral Type:   Physical Medicine    Referral Reason:   Specialty Services Required    Requested Specialty:   Physical Therapy    Number of Visits Requested:   1    Discussed warning signs or symptoms. Please see discharge instructions. Patient expresses understanding.

## 2016-12-19 NOTE — Patient Instructions (Addendum)
Thank you for coming in today. Attend PT.  Use tramadol sparingly.  Recheck in 3-6 months or sooner if needed.   Return sooner if needed.

## 2016-12-26 LAB — PAIN MGMT, PROFILE 6 CONF W/O MM, U: Please note:: 0

## 2017-01-02 ENCOUNTER — Ambulatory Visit (INDEPENDENT_AMBULATORY_CARE_PROVIDER_SITE_OTHER): Payer: 59 | Admitting: Physical Therapy

## 2017-01-02 ENCOUNTER — Encounter: Payer: Self-pay | Admitting: Physical Therapy

## 2017-01-02 DIAGNOSIS — M6281 Muscle weakness (generalized): Secondary | ICD-10-CM | POA: Diagnosis not present

## 2017-01-02 DIAGNOSIS — M62838 Other muscle spasm: Secondary | ICD-10-CM | POA: Diagnosis not present

## 2017-01-02 DIAGNOSIS — M5442 Lumbago with sciatica, left side: Secondary | ICD-10-CM | POA: Diagnosis not present

## 2017-01-02 DIAGNOSIS — M5441 Lumbago with sciatica, right side: Secondary | ICD-10-CM | POA: Diagnosis not present

## 2017-01-02 DIAGNOSIS — G8929 Other chronic pain: Secondary | ICD-10-CM

## 2017-01-02 NOTE — Patient Instructions (Addendum)
Trigger Point Dry Needling  . What is Trigger Point Dry Needling (DN)? o DN is a physical therapy technique used to treat muscle pain and dysfunction. Specifically, DN helps deactivate muscle trigger points (muscle knots).  o A thin filiform needle is used to penetrate the skin and stimulate the underlying trigger point. The goal is for a local twitch response (LTR) to occur and for the trigger point to relax. No medication of any kind is injected during the procedure.   . What Does Trigger Point Dry Needling Feel Like?  o The procedure feels different for each individual patient. Some patients report that they do not actually feel the needle enter the skin and overall the process is not painful. Very mild bleeding may occur. However, many patients feel a deep cramping in the muscle in which the needle was inserted. This is the local twitch response.   Marland Kitchen How Will I feel after the treatment? o Soreness is normal, and the onset of soreness may not occur for a few hours. Typically this soreness does not last longer than two days.  o Bruising is uncommon, however; ice can be used to decrease any possible bruising.  o In rare cases feeling tired or nauseous after the treatment is normal. In addition, your symptoms may get worse before they get better, this period will typically not last longer than 24 hours.   . What Can I do After My Treatment? o Increase your hydration by drinking more water for the next 24 hours. o You may place ice or heat on the areas treated that have become sore, however, do not use heat on inflamed or bruised areas. Heat often brings more relief post needling. o You can continue your regular activities, but vigorous activity is not recommended initially after the treatment for 24 hours. o DN is best combined with other physical therapy such as strengthening, stretching, and other therapies.    Abdominal Bracing With Pelvic Floor (Hook-Lying)   With neutral spine, tighten  pelvic floor and abdominals. Hold 5 seconds. Repeat __10_ times. Do _1__ times a day.  Knee to Chest: Transverse Plane Stability   Bring one knee up, then return. Be sure pelvis does not roll side to side. Keep pelvis still. Lift knee __10_ times each leg. Restabilize pelvis. Repeat with other leg. Do _1-2__ sets, _1__ times per day.  Hip External Rotation With Pillow: Transverse Plane Stability   One knee bent, one leg straight, on pillow. Slowly roll bent knee out. Be sure pelvis does not rotate. Do _10__ times. Restabilize pelvis. Repeat with other leg. Do _1-2__ sets, _1__ times per day.  Lumbar Rotation Stretch   Lie on back with left knee drawn toward chest. Slowly bring bent leg across body until stretch is felt in lower back/hip area. Hold __30__ seconds. Repeat __1__ times per set. Do __1__ sets per session. Do __1-2__ sessions per day. Repeat on the other side.   [facility] at [place] [place] [address] [place],  [address]  [phone] (office) [phone] (fax)  h    Copyright  VHI. All rights reserved.

## 2017-01-02 NOTE — Therapy (Signed)
[place], [place], [address] Phone: [phone]   Fax:  [phone]  Physical Therapy Evaluation  Patient Details  Name: Aimee Garza MRN: [id number] Date of Birth: 1971-04-27 Referring Provider: Dr Lynne Leader  Encounter Date: 01/02/2017      PT End of Session - 01/02/17 1107    Visit Number 1   Number of Visits 6   Date for PT Re-Evaluation 02/13/17   PT Start Time 1107   PT Stop Time 1141   PT Time Calculation (min) 34 min      Past Medical History:  Diagnosis Date  . Bipolar disorder ([place]) 03/23/2014    History reviewed. No pertinent surgical history.  There were no vitals filed for this visit.       Subjective Assessment - 01/02/17 1107    Subjective Pt reports she has pain in the middle of her low back for about a year.  Was wokring with another MD first and used medication to manage it.  Occassionally will have shooting pain down the Rt LE ( hasn't happened in about 2 months ) She had a physical and they discussed her trying PT    Pertinent History currently  not going to the gym due to her work schedule.    How long can you walk comfortably? some times limited    Diagnostic tests x-rays - negative   Patient Stated Goals help the pain and get rid of some of the aches and pains.    Currently in Pain? Yes   Pain Score 2    Pain Location Back   Pain Orientation Mid   Pain Descriptors / Indicators Aching   Pain Type Chronic pain   Pain Onset More than a month ago   Pain Frequency Constant   Aggravating Factors  lifting her children, sitting on the floor   Pain Relieving Factors medication, rest and let it pass.             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      Observation/Other Assessments   Focus on Therapeutic Outcomes (FOTO)  28% limited     Functional Tests   Functional tests Squat;Single leg stance     Squat   Comments WNL     Single Leg Stance   Comments WNL     Posture/Postural Control   Posture/Postural Control Postural limitations   Postural Limitations Forward head;Rounded Shoulders;Increased lumbar lordosis  head tilted to Rt      ROM / Strength   AROM / PROM / Strength AROM;Strength     AROM   AROM Assessment Site Lumbar   Lumbar Flexion WNL   Lumbar Extension WNL, with pain   Lumbar - Right  Side Bend WNL   Lumbar - Left Side Bend WNL   Lumbar - Right Rotation WNL   Lumbar - Left Rotation WNL - slight catch in middle of low      Strength   Strength Assessment Site Hip;Knee;Ankle;Lumbar   Right/Left Hip --  WNL except Rt hip ext 4+/5   Right/Left Knee --  WNL   Right/Left Ankle --  WNL   Lumbar Flexion --  TA flickering, fair   Lumbar Extension --  multifidi good (-)      Flexibility   Soft Tissue Assessment /Muscle Length yes   Hamstrings bilat WNL    ITB WNL      Palpation   Spinal mobility pain with CPA mobs L3   SI assessment  WNL   Palpation comment tightness and some tenderness in bilat lumbar paraspinals, gluts, piriformins                   [facility] Adult PT Treatment/Exercise - 01/02/17 0001      Exercises   Exercises Lumbar     Lumbar Exercises: Stretches   Lower Trunk Rotation 1 rep;30 seconds  each side per hand out.      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  She has some deep core weakness and tightness in her lumbar paraspinals and gluts    PT Frequency 1x / week   PT Duration 6 weeks   PT Treatment/Interventions Moist Heat;Traction;Ultrasound;Therapeutic exercise;Dry needling;Taping;Manual techniques;Neuromuscular re-education;Cryotherapy;Electrical Stimulation;Patient/family education   PT Next Visit Plan possible DN to lumbar paraspinals, advance core work, Arts development officer ed   Consulted and Agree with Plan of Care Patient      Patient will benefit from skilled therapeutic intervention in order to improve the following deficits and impairments:  Decreased strength, Pain, Increased muscle spasms, Improper body mechanics  Visit Diagnosis: Muscle weakness (generalized) - Plan: PT plan of care cert/re-cert  Chronic midline low back pain with bilateral sciatica - Plan: PT plan of care cert/re-cert  Other muscle spasm - Plan: PT plan of  care cert/re-cert     Problem List Patient Active Problem List   Diagnosis Date Noted  .  Chronic pain syndrome 12/19/2016  . Right low back pain 06/15/2015  . Bipolar disorder ([place]) 03/23/2014  . Family history of breast cancer 03/23/2014    Jeral Pinch PT 01/02/2017, 12:10 PM  [facility] [place], [place], [address] Phone: [phone]   Fax:  [phone]  Name: Aimee Garza MRN: [id number] Date of Birth: [birth year]

## 2017-01-09 ENCOUNTER — Ambulatory Visit (INDEPENDENT_AMBULATORY_CARE_PROVIDER_SITE_OTHER): Payer: 59 | Admitting: Physical Therapy

## 2017-01-09 ENCOUNTER — Encounter: Payer: Self-pay | Admitting: Physical Therapy

## 2017-01-09 DIAGNOSIS — M62838 Other muscle spasm: Secondary | ICD-10-CM

## 2017-01-09 DIAGNOSIS — G8929 Other chronic pain: Secondary | ICD-10-CM

## 2017-01-09 DIAGNOSIS — M5441 Lumbago with sciatica, right side: Secondary | ICD-10-CM

## 2017-01-09 DIAGNOSIS — M6281 Muscle weakness (generalized): Secondary | ICD-10-CM

## 2017-01-09 DIAGNOSIS — M5442 Lumbago with sciatica, left side: Secondary | ICD-10-CM

## 2017-01-09 NOTE — Therapy (Signed)
[place], [place], [address] Phone: [phone]   Fax:  [phone]  Physical Therapy Treatment  Patient Details  Name: Aimee Garza MRN: [id number] Date of Birth: 1971/03/22 Referring Provider: Dr Lynne Leader  Encounter Date: 01/09/2017      Garza End of Session - 01/09/17 1151    Visit Number 2   Number of Visits 6   Date for Garza Re-Evaluation 02/13/17   Garza Start Time 1518   Garza Stop Time 1253   Garza Time Calculation (min) 62 min   Activity Tolerance Patient tolerated treatment well      Past Medical History:  Diagnosis Date  . Bipolar disorder ([place]) 03/23/2014    History reviewed. No pertinent surgical history.  There were no vitals filed for this visit.      Subjective Assessment - 01/09/17 1151    Subjective Garza is doing her HEP and thinks they are helping, her muscles have been sore form the exercise.  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    Patient Stated Goals help the pain and get rid of some of the aches and pains.    Currently in Pain? Yes   Pain Score 3    Pain Location Back   Pain Orientation Lower;Mid   Pain Descriptors / Indicators Aching   Pain Type Chronic pain   Pain Onset More than a month ago   Pain Frequency Constant   Aggravating Factors  lifting  a client ( 50#)    Pain Relieving Factors medication                         [facility] Adult Garza Treatment/Exercise - 01/09/17 0001      Self-Care   Self-Care Other Self-Care Comments     Lumbar Exercises: Stretches   Piriformis Stretch 30 seconds  each side     Lumbar Exercises: Aerobic   Stationary Bike nustep L5 x 5'     Lumbar Exercises: Supine   Bridge 20 reps  figure 4 both sides.      Modalities   Modalities Electrical Stimulation;Moist Heat     Moist Heat Therapy   Number Minutes Moist Heat 15 Minutes   Moist Heat Location  Lumbar Spine     Electrical Stimulation   Electrical Stimulation Location lumbar   Electrical Stimulation Action IFC and with stim   Electrical Stimulation Parameters to tolerance   Electrical Stimulation Goals Pain;Tone     Manual Therapy   Manual Therapy Soft tissue mobilization   Soft tissue mobilization bilat lumbar paraspinals, gluts and piriformis          Trigger Point Dry Needling - 01/09/17 1222    Consent Given? 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  She had good twitch response to DN.  still needs to practice body mechanics at home and core strengthening.  May also need more manual work    Rehab Potential Good   Garza Frequency 1x / week   Garza Duration 6 weeks   Garza Treatment/Interventions Moist Heat;Traction;Ultrasound;Therapeutic exercise;Dry needling;Taping;Manual techniques;Neuromuscular re-education;Cryotherapy;Electrical Stimulation;Patient/family education   Garza Next Visit Plan assess response to DN    Consulted and Agree with Plan of Care Patient       Patient will benefit from skilled therapeutic intervention in order to improve the following deficits and impairments:  Decreased strength, Pain, Increased muscle spasms, Improper body mechanics  Visit Diagnosis: Muscle weakness (generalized)  Chronic midline low back pain with bilateral sciatica  Other muscle spasm     Problem List Patient Active Problem List   Diagnosis Date Noted  . Chronic pain syndrome 12/19/2016  . Right low back pain 06/15/2015  . Bipolar disorder ([place]) 03/23/2014  . Family history of breast cancer 03/23/2014    Aimee Garza 01/09/2017, 12:44 PM  [facility] [place], [place], [address] Phone: [phone]   Fax:  [phone]  Name: Aimee Garza MRN: [id number] Date of Birth: 09/13/1971

## 2017-01-09 NOTE — Patient Instructions (Addendum)
One Knee    Slide object up one thigh, and hold close at waist level with both hands before standing up.   Copyright  VHI. All rights reserved.  Deep Squat    Squat and lift with both arms held against upper trunk. Tighten stomach muscles without holding breath. Use smooth movements to avoid jerking.  Copyright  VHI. All rights reserved.  Childcare - In / Out of Tub    Squat or kneel down close to edge of tub to lower child into tub or to lift out. Be sure there is a safety mat inside.   Copyright  VHI. All rights reserved.  Childcare - Carrying    Keep baby close and as upright as possible.   Copyright  VHI. All rights reserved.  Childcare - Picking Up from [place] down to pick up baby, and bring close before standing up. Use knees and keep back straight.   Copyright  VHI. All rights reserved.  Laundry - Unloading Wash    To unload small items at bottom of washer, lift leg opposite to arm being used to reach.   Copyright  VHI. All rights reserved.  Housework - Vacuuming    Hold the vacuum with arm held at side. Step back and forth to move it, keeping head up. Avoid twisting.   Copyright  VHI. All rights reserved.

## 2017-01-15 ENCOUNTER — Other Ambulatory Visit: Payer: Self-pay

## 2017-01-15 MED ORDER — GABAPENTIN 100 MG PO CAPS: ORAL_CAPSULE | ORAL | 2 refills | Status: DC

## 2017-01-16 ENCOUNTER — Ambulatory Visit (INDEPENDENT_AMBULATORY_CARE_PROVIDER_SITE_OTHER): Payer: 59 | Admitting: Physical Therapy

## 2017-01-16 ENCOUNTER — Encounter: Payer: Self-pay | Admitting: Physical Therapy

## 2017-01-16 DIAGNOSIS — M5442 Lumbago with sciatica, left side: Secondary | ICD-10-CM | POA: Diagnosis not present

## 2017-01-16 DIAGNOSIS — M62838 Other muscle spasm: Secondary | ICD-10-CM | POA: Diagnosis not present

## 2017-01-16 DIAGNOSIS — G8929 Other chronic pain: Secondary | ICD-10-CM

## 2017-01-16 DIAGNOSIS — M6281 Muscle weakness (generalized): Secondary | ICD-10-CM

## 2017-01-16 DIAGNOSIS — M5441 Lumbago with sciatica, right side: Secondary | ICD-10-CM | POA: Diagnosis not present

## 2017-01-16 NOTE — Therapy (Signed)
[place], [place], [address] Phone: [phone]   Fax:  [phone]  Physical Therapy Treatment  Patient Details  Name: Aimee Garza MRN: [id number] Date of Birth: [birth year] Referring Provider: Dr Georgina Snell  Encounter Date: 01/16/2017      PT End of Session - 01/16/17 1153    Visit Number 3   Number of Visits 6   Date for PT Re-Evaluation 02/13/17   PT Start Time 8756   PT Stop Time 1253   PT Time Calculation (min) 60 min   Activity Tolerance Patient tolerated treatment well      Past Medical History:  Diagnosis Date  . Bipolar disorder ([place]) 03/23/2014    History reviewed. No pertinent surgical history.  There were no vitals filed for this visit.      Subjective Assessment - 01/16/17 1154    Subjective Pt reports the DN helped her alot for a couple of day. Today her knees are very sore and the Rt hamstring too.  She did work an extra shift on Sunday   Patient Stated Goals help the pain and get rid of some of the aches and pains.    Currently in Pain? Yes   Pain Score 5    Pain Location --  whole body ache - feels like its related to the weather.    Pain Onset More than a month ago   Pain Frequency Constant   Aggravating Factors  weather today.    Pain Relieving Factors medication            [facility] PT Assessment - 01/16/17 0001      Assessment   Medical Diagnosis Rt sided LBP   Referring Provider Dr Georgina Snell   Onset Date/Surgical Date 01/03/16     Strength   Strength Assessment Site Hip   Right/Left Hip --  bilat hip ext 5/5                     [facility] Adult PT Treatment/Exercise - 01/16/17 0001      Lumbar Exercises: Stretches   Quadruped Mid Back Stretch --  10 reps cat/cow, then childs pose     Lumbar Exercises: Aerobic   Stationary Bike nustep L5 x 5'     Lumbar Exercises: Supine   Bridge 20 reps  single leg with other lifted up   Straight Leg Raise  20 reps  both sides, reaching with opposite arm to leg, TA contractio   Other Supine Lumbar Exercises 10reps each CW/CCW leg circles with TA contraction     Lumbar Exercises: Prone   Other Prone Lumbar Exercises 10reps lower body lifts with 5 heel taps.      Modalities   Modalities Electrical Stimulation;Moist Heat     Moist Heat Therapy   Number Minutes Moist Heat 15 Minutes   Moist Heat Location Lumbar Spine     Electrical Stimulation   Electrical Stimulation Location lumbar   Electrical Stimulation Action IFC   Electrical Stimulation Parameters  to tolerance   Electrical Stimulation Goals Pain;Tone     Manual Therapy   Manual Therapy Soft tissue mobilization;Joint mobilization   Joint Mobilization grade III CPA L1-5, Rt UPA L1-5   Soft tissue mobilization Rt lumbar paraspinals and QL STW          Trigger Point Dry Needling - 01/16/17 1233    Consent Given? 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  She has generalized pain today due to the weather changes. No goals met however making progress to  them.    Rehab Potential Good   PT Frequency 1x / week   PT Duration 6 weeks   PT Treatment/Interventions Moist Heat;Traction;Ultrasound;Therapeutic exercise;Dry needling;Taping;Manual techniques;Neuromuscular re-education;Cryotherapy;Electrical Stimulation;Patient/family education   PT Next Visit Plan assess response to DN and progress HEP   Consulted and Agree with Plan of Care Patient      Patient will benefit from skilled therapeutic intervention in order to improve the following deficits and impairments:  Decreased strength, Pain, Increased muscle spasms, Improper body mechanics  Visit Diagnosis: Muscle weakness (generalized)  Chronic midline low back pain with bilateral sciatica  Other muscle spasm     Problem List Patient Active Problem List   Diagnosis Date Noted  . Chronic pain syndrome 12/19/2016  . Right low back pain 06/15/2015  . Bipolar disorder ([place]) 03/23/2014  . Family history of breast cancer 03/23/2014    Jeral Pinch PT  01/16/2017, 12:44 PM  [facility] [place], [place], [address] Phone: [phone]   Fax:  [phone]  Name: Kafi Dotter MRN: [id number] Date of Birth: 1971/01/14

## 2017-01-23 ENCOUNTER — Encounter: Payer: Self-pay | Admitting: Physical Therapy

## 2017-01-23 ENCOUNTER — Ambulatory Visit (INDEPENDENT_AMBULATORY_CARE_PROVIDER_SITE_OTHER): Payer: 59 | Admitting: Physical Therapy

## 2017-01-23 DIAGNOSIS — M6281 Muscle weakness (generalized): Secondary | ICD-10-CM | POA: Diagnosis not present

## 2017-01-23 DIAGNOSIS — G8929 Other chronic pain: Secondary | ICD-10-CM

## 2017-01-23 DIAGNOSIS — M5441 Lumbago with sciatica, right side: Secondary | ICD-10-CM

## 2017-01-23 DIAGNOSIS — M5442 Lumbago with sciatica, left side: Secondary | ICD-10-CM

## 2017-01-23 DIAGNOSIS — M62838 Other muscle spasm: Secondary | ICD-10-CM | POA: Diagnosis not present

## 2017-01-23 NOTE — Therapy (Signed)
[place], [place], [address] Phone: [phone]   Fax:  [phone]  Physical Therapy Treatment  Patient Details  Name: Aimee Garza MRN: [id number] Date of Birth: [birth year] Referring Provider: Dr Georgina Snell  Encounter Date: 01/23/2017      PT End of Session - 01/23/17 1146    Visit Number 4   Number of Visits 6   Date for PT Re-Evaluation 02/13/17   PT Start Time 1147   PT Stop Time 1223   PT Time Calculation (min) 36 min   Activity Tolerance Patient tolerated treatment well      Past Medical History:  Diagnosis Date  . Bipolar disorder ([place]) 03/23/2014    History reviewed. No pertinent surgical history.  There were no vitals filed for this visit.      Subjective Assessment - 01/23/17 1149    Subjective She thinks the DN helped, only has a headache today from allgeries   Patient Stated Goals help the pain and get rid of some of the aches and pains.    Currently in Pain? No/denies                         [facility] Adult PT Treatment/Exercise - 01/23/17 0001      Lumbar Exercises: Aerobic   Stationary Bike nustep L5 x 5'     Lumbar Exercises: Supine   Ab Set 20 reps  with partial leg lowering feet ER   Bridge 10 reps  articulating bridges     Lumbar Exercises: Prone   Opposite Arm/Leg Raise Left arm/Right leg;Right arm/Left leg;20 reps   Other Prone Lumbar Exercises 6reps lower body lifts with 5 heel taps.      Manual Therapy   Manual Therapy Joint mobilization   Manual therapy comments trigger point release to bilat lumbar paraspinals with trigger tool.    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  She continues to be weak in her core and is missing spinal stablization. She developed increased HA symptoms with ther ex today so we fininshed up a little earlier.  She has met one goal and progressing to the others.    Rehab Potential Good   PT Frequency 1x / week   PT Duration 6 weeks   PT Treatment/Interventions Moist Heat;Traction;Ultrasound;Therapeutic exercise;Dry needling;Taping;Manual techniques;Neuromuscular re-education;Cryotherapy;Electrical Stimulation;Patient/family education   PT Next Visit Plan FOTO and assess, if  still doing well may be ready for discharge   Consulted and Agree with Plan of Care Patient      Patient will benefit from skilled therapeutic intervention in order to improve the following deficits and impairments:  Decreased strength, Pain, Increased muscle spasms, Improper body mechanics  Visit Diagnosis: Muscle weakness (generalized)  Chronic midline low back pain with bilateral sciatica  Other muscle spasm     Problem List Patient Active Problem List   Diagnosis Date Noted  . Chronic pain syndrome 12/19/2016  . Right low back pain 06/15/2015  . Bipolar disorder ([place]) 03/23/2014  . Family history of breast cancer 03/23/2014    Jeral Pinch PT  01/23/2017, 12:27 PM  [facility] [place], [place], [address] Phone: [phone]   Fax:  [phone]  Name: Tanazia Achee MRN: [id number] Date of Birth: 1971-02-08

## 2017-01-23 NOTE — Patient Instructions (Addendum)
Prone Leg Beats = keep knees straight   Lie on stomach, forehead on hands. Exhale, raising legs, slightly turned out. Inhale, beating heels together for _5___ small beats. Exhale, lowering legs. Repeat __6 __ times. Do _1___ sessions per day.  Swim Prep - start in a lowered position, raise upper body when lower is easy and you have minimal to no trunk movement.     Lie on stomach, arms extended, hands down, shoulders pulled down. Exhale, pressing arms down to lift upper body slightly. Inhale, raising right arm and left leg. Exhale on return. Inhale, raising left arm and right leg. Repeat __2x10__ times, alternating. Do _1___ sessions per day.  [place]amond - keep pelvis still, back flat.  Only lower legs as far as you can while maintaining that.     Lie on back, legs toward ceiling. Turn legs all the way out, flex feet, bend knees halfway. Exhale, lowering legs to 45 above floor. Inhale, returning. Repeat _2x10___ times. Do __1__ sessions per day.  Leg Crosses (Intermediate / Advanced) - slow and controlled.     Lie on back, legs toward ceiling, crossed. Inhale, opening legs slightly wider than hips. Exhale, closing legs, other leg in front. Repeat __1-2x10__ times. Do __1__ sessions per day. ADVANCED: Lower legs with motion.  Copyright  VHI. All rights reserved.

## 2017-01-30 ENCOUNTER — Encounter: Payer: Self-pay | Admitting: Physical Therapy

## 2017-01-30 ENCOUNTER — Ambulatory Visit (INDEPENDENT_AMBULATORY_CARE_PROVIDER_SITE_OTHER): Payer: 59 | Admitting: Physical Therapy

## 2017-01-30 DIAGNOSIS — G8929 Other chronic pain: Secondary | ICD-10-CM | POA: Diagnosis not present

## 2017-01-30 DIAGNOSIS — M62838 Other muscle spasm: Secondary | ICD-10-CM

## 2017-01-30 DIAGNOSIS — M6281 Muscle weakness (generalized): Secondary | ICD-10-CM

## 2017-01-30 DIAGNOSIS — M5441 Lumbago with sciatica, right side: Secondary | ICD-10-CM | POA: Diagnosis not present

## 2017-01-30 DIAGNOSIS — M5442 Lumbago with sciatica, left side: Secondary | ICD-10-CM | POA: Diagnosis not present

## 2017-01-30 NOTE — Therapy (Signed)
[facility] [address] [place], [place], [address] Phone: [phone]   Fax:  [phone]  Physical Therapy Treatment  Patient Details  Name: Aimee Garza MRN: [id number] Date of Birth: 05/02/1971 Referring Provider: Dr COrey  Encounter Date: 01/30/2017      PT End of Session - 01/30/17 1155    Visit Number 5   Number of Visits 6   Date for PT Re-Evaluation 02/13/17   PT Start Time 1155   PT Stop Time 1231   PT Time Calculation (min) 36 min   Activity Tolerance Patient tolerated treatment well      Past Medical History:  Diagnosis Date  . Bipolar disorder (HCC) 03/23/2014    History reviewed. No pertinent surgical history.  There were no vitals filed for this visit.      Subjective Assessment - 01/30/17 1157    Subjective Aimee Garza reports she has been doing well over the last week, she didn't do as much stretching last week as she should have. Having work done at her house.    Currently in Pain? No/denies            [facility] PT Assessment - 01/30/17 0001      Assessment   Medical Diagnosis Rt sided LBP   Referring Provider Dr COrey   Onset Date/Surgical Date 01/03/16     Observation/Other Assessments   Focus on Therapeutic Outcomes (FOTO)  17% limited     AROM   AROM Assessment Site Lumbar   Lumbar Flexion WNL   Lumbar Extension WNL   Lumbar - Right Side Bend WNL   Lumbar - Left Side Bend WNL   Lumbar - Right Rotation WNL, slight catch at end range   Lumbar - Left Rotation WNL     Strength   Right/Left Hip --  WNL   Right/Left Knee --  WNL   Right/Left Ankle --  WNL   Lumbar Flexion --  TA good   Lumbar Extension --  multifidi good                     [facility] Adult PT Treatment/Exercise - 01/30/17 0001      Lumbar Exercises: Stretches   Double Knee to Chest Stretch 30 seconds   Lower Trunk Rotation 30 seconds  each side   Standing Extension Limitations standing hip  flexion each side   ITB Stretch 3 reps;30 seconds  each side with strap, cross body     Lumbar Exercises: Aerobic   Stationary Bike nustep L5 x 5'     Lumbar Exercises: Supine   Ab Set 20 reps  with partial lowering feet, LE's ER'd   Other Supine Lumbar Exercises 2x10 dead bugs, holding 5# in hands     Lumbar Exercises: Quadruped   Straight Leg Raise 20 reps  leg press with red band, each side.                      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 Chronic pain syndrome 12/19/2016  . Right low back pain 06/15/2015  . Bipolar disorder (HCC) 03/23/2014  . Family history of breast cancer 03/23/2014    Susan Shaver PT  01/30/2017, 12:51 PM  [facility] [address] [place], [place], [address] Phone: [phone]   Fax:  [phone]  Name: Aimee Garza MRN: [id number] Date of Birth: 10/13/1971  PHYSICAL THERAPY DISCHARGE SUMMARY  Visits from Start of Care: 5  Current functional level related to goals / functional outcomes: See above   Remaining deficits: none   Education /  Equipment: HEP Plan: Patient agrees to discharge.  Patient goals were met. Patient is being discharged due to meeting the stated rehab goals.  ?????    Susan Shaver, PT 01/30/17 12:52 PM  

## 2017-01-30 NOTE — Patient Instructions (Addendum)
Outer Hip Stretch: Reclined IT Band Stretch (Strap)    Strap around opposite foot, pull across only as far as possible with shoulders on mat. Hold for _30-45___ sec. Repeat _2-3___ times each leg.  Copyright  VHI. All rights reserved.

## 2017-02-13 ENCOUNTER — Other Ambulatory Visit: Payer: Self-pay | Admitting: Family Medicine

## 2017-04-09 ENCOUNTER — Other Ambulatory Visit: Payer: Self-pay | Admitting: Family Medicine

## 2017-04-13 ENCOUNTER — Ambulatory Visit (INDEPENDENT_AMBULATORY_CARE_PROVIDER_SITE_OTHER): Payer: 59 | Admitting: Family Medicine

## 2017-04-13 ENCOUNTER — Encounter: Payer: Self-pay | Admitting: Family Medicine

## 2017-04-13 VITALS — BP 131/88 | HR 101 | Wt 173.0 lb

## 2017-04-13 DIAGNOSIS — G894 Chronic pain syndrome: Secondary | ICD-10-CM

## 2017-04-13 DIAGNOSIS — Z803 Family history of malignant neoplasm of breast: Secondary | ICD-10-CM

## 2017-04-13 DIAGNOSIS — F3176 Bipolar disorder, in full remission, most recent episode depressed: Secondary | ICD-10-CM | POA: Diagnosis not present

## 2017-04-13 DIAGNOSIS — Z1239 Encounter for other screening for malignant neoplasm of breast: Secondary | ICD-10-CM

## 2017-04-13 DIAGNOSIS — Z114 Encounter for screening for human immunodeficiency virus [HIV]: Secondary | ICD-10-CM

## 2017-04-13 DIAGNOSIS — G8929 Other chronic pain: Secondary | ICD-10-CM | POA: Diagnosis not present

## 2017-04-13 DIAGNOSIS — Z1231 Encounter for screening mammogram for malignant neoplasm of breast: Secondary | ICD-10-CM

## 2017-04-13 DIAGNOSIS — M5441 Lumbago with sciatica, right side: Secondary | ICD-10-CM

## 2017-04-13 LAB — COMPLETE METABOLIC PANEL WITH GFR
ALT: 9 U/L (ref 6–29)
AST: 15 U/L (ref 10–30)
Albumin: 4.3 g/dL (ref 3.6–5.1)
Alkaline Phosphatase: 72 U/L (ref 33–115)
BUN: 11 mg/dL (ref 7–25)
CHLORIDE: 109 mmol/L (ref 98–110)
CO2: 21 mmol/L (ref 20–31)
CREATININE: 0.79 mg/dL (ref 0.50–1.10)
Calcium: 9.1 mg/dL (ref 8.6–10.2)
GFR, Est African American: >89 mL/min (ref >=60)
GFR, Est Non African American: >89 mL/min (ref >=60)
GLUCOSE: 86 mg/dL (ref 65–99)
POTASSIUM: 4.9 mmol/L (ref 3.5–5.3)
SODIUM: 139 mmol/L (ref 135–146)
Total Bilirubin: 0.3 mg/dL (ref 0.2–1.2)
Total Protein: 6.5 g/dL (ref 6.1–8.1)

## 2017-04-13 LAB — CBC
HCT: 41.9 % (ref 35.0–45.0)
HEMOGLOBIN: 13.8 g/dL (ref 11.7–15.5)
MCH: 30.9 pg (ref 27.0–33.0)
MCHC: 32.9 g/dL (ref 32.0–36.0)
MCV: 93.7 fL (ref 80.0–100.0)
MPV: 9.9 fL (ref 7.5–12.5)
Platelets: 261 10*3/uL (ref 140–400)
RBC: 4.47 MIL/uL (ref 3.80–5.10)
RDW: 13.8 % (ref 11.0–15.0)
WBC: 4.2 10*3/uL (ref 3.8–10.8)

## 2017-04-13 LAB — LIPID PANEL W/REFLEX DIRECT LDL
CHOL/HDL RATIO: 2.8 ratio (ref <5.0)
Cholesterol: 154 mg/dL (ref <200)
HDL: 55 mg/dL (ref >50)
LDL-CHOLESTEROL: 84 mg/dL
NON-HDL CHOLESTEROL (CALC): 99 mg/dL (ref <130)
Triglycerides: 66 mg/dL (ref <150)

## 2017-04-13 LAB — TSH: TSH: 0.96 m[IU]/L

## 2017-04-13 MED ORDER — CYCLOBENZAPRINE HCL 10 MG PO TABS: 5.0000 mg | ORAL_TABLET | Freq: Three times a day (TID) | ORAL | 1 refills | Status: DC | PRN

## 2017-04-13 NOTE — Progress Notes (Signed)
Aimee Garza is a 46 y.o. female who presents to [facility] today for follow-up back pain and bipolar disorder and prepare for well visit.  Patient is here to discuss her bipolar disorder. She's doing well with Seroquel and Lexapro. She does note some decreased libido that she attributes to Lexapro. She notes she's tried other medications previously as well as Wellbutrin which she did not tolerate. She is happy with her current regimen. She denies any manic or severe depressive episodes.  Additionally she notes back pain. She suffered a back injury at work and is currently being seen by a Risk analyst. She is doing reasonably well but notes right-sided radicular pain. She notes that she is about to be referred to orthopedics for MRI. She would like a refill of Flexeril if possible as this does help.   Past Medical History:  Diagnosis Date  . Bipolar disorder ([facility]) 03/23/2014   No past surgical history on file. Social History  Substance Use Topics  . Smoking status: Current Every Day Smoker    Packs/day: 1.00    Years: 26.00    Types: Cigarettes  . Smokeless tobacco: Never Used  . Alcohol use Yes   family history includes Breast cancer in her mother.  ROS as above:  Medications: Current Outpatient Prescriptions  Medication Sig Dispense Refill  . cyclobenzaprine (FLEXERIL) 10 MG tablet Take 0.5-1 tablets (5-10 mg total) by mouth 3 (three) times daily as needed for muscle spasms. 90 tablet 1  . escitalopram (LEXAPRO) 10 MG tablet Take 1 tablet (10 mg total) by mouth daily. 90 tablet 1  . fluconazole (DIFLUCAN) 100 MG tablet Take 1 tablet (100 mg total) by mouth daily. 7 tablet 1  . gabapentin (NEURONTIN) 100 MG capsule TAKE ONE CAPSULE BY MOUTH 3 TIMES A DAY AS NEEDED FOR PAIN 90 capsule 2  . QUEtiapine (SEROQUEL XR) 200 MG 24 hr tablet Take 1  tablet (200 mg total) by mouth at bedtime. 90 tablet 1  . topiramate (TOPAMAX) 50 MG tablet Take 1 tablet (50 mg total) by mouth 2 (two) times daily. 180 tablet 1  . traMADol (ULTRAM) 50 MG tablet Take 1 tablet (50 mg total) by mouth every 6 (six) hours as needed. for pain 45 tablet 5  . HYDROcodone-acetaminophen (NORCO/VICODIN) 5-325 MG tablet Take 1 tablet by mouth every 6 (six) hours as needed.  0   No current facility-administered medications for this visit.    No Known Allergies  Health Maintenance Health Maintenance  Topic Date Due  . HIV Screening  12/02/1987  . PAP SMEAR  04/14/2017  . INFLUENZA VACCINE  07/04/2017  . TETANUS/TDAP  03/21/2026     Exam:  BP 131/88   Pulse (!) 101   Wt 173 lb (78.5 kg)   BMI 27.92 kg/m  Gen: Well NAD HEENT: EOMI,  MMM Lungs: Normal work of breathing. CTABL Heart: RRR no MRG Abd: NABS, Soft. Nondistended, Nontender Exts: Brisk capillary refill, warm and well perfused.  Psych alert and oriented normal speech the process and affect Back nontender to midline normal gait.    No results found for this or any previous visit (from the past 72 hour(s)). No results found.    Assessment and Plan: 46 y.o. female with  Bipolar doing well continue current regimen. We'll check basic labs to assess safety of these medications that she is taking. Additionally we'll get labs in preparation for wellness visit.  As for her back pain will defer to [employer]. I'm happy to take over care of needed. Refill Flexeril.  Preparation for wellness visit: Mammogram and fasting labs ordered. Return in the near future for cervical cancer screening and wellness visit.  Orders Placed This Encounter  Procedures  . MM SCREENING BREAST TOMO BILATERAL    Standing Status:   Future    Standing Expiration Date:   06/13/2018    Order Specific Question:   Reason for Exam (SYMPTOM  OR DIAGNOSIS REQUIRED)    Answer:   eval breast cancer screening    Order  Specific Question:   Is the patient pregnant?    Answer:   No    Order Specific Question:   Preferred imaging location?    Answer:   [facility]  . CBC  . COMPLETE METABOLIC PANEL WITH GFR  . Lipid Panel w/reflex Direct LDL  . TSH  . VITAMIN D 25 Hydroxy (Vit-D Deficiency, Fractures)  . HIV antibody     Discussed warning signs or symptoms. Please see discharge instructions. Patient expresses understanding.

## 2017-04-13 NOTE — Patient Instructions (Signed)
Thank you for coming in today. Get labs and mammogram.  Reschedule for a well adult visit with pap smear in the near future.

## 2017-04-14 LAB — VITAMIN D 25 HYDROXY (VIT D DEFICIENCY, FRACTURES): VIT D 25 HYDROXY: 33 ng/mL (ref 30–100)

## 2017-04-14 LAB — HIV ANTIBODY (ROUTINE TESTING W REFLEX): HIV: NONREACTIVE

## 2017-04-24 ENCOUNTER — Ambulatory Visit (INDEPENDENT_AMBULATORY_CARE_PROVIDER_SITE_OTHER): Payer: 59 | Admitting: Family Medicine

## 2017-04-24 ENCOUNTER — Encounter: Payer: Self-pay | Admitting: Family Medicine

## 2017-04-24 ENCOUNTER — Other Ambulatory Visit (HOSPITAL_COMMUNITY)
Admission: RE | Admit: 2017-04-24 | Discharge: 2017-04-24 | Disposition: A | Discharge status: Home / Self Care | Payer: 59 | Source: Ambulatory Visit | Attending: Family Medicine | Admitting: Family Medicine

## 2017-04-24 VITALS — BP 106/77 | HR 108 | Ht 66.0 in | Wt 172.0 lb

## 2017-04-24 DIAGNOSIS — Z0000 Encounter for general adult medical examination without abnormal findings: Secondary | ICD-10-CM

## 2017-04-24 NOTE — Progress Notes (Signed)
Aimee Garza is a 46 y.o. female who presents to [facility] today for well adult visit. Patient is doing well with no active issues today. She does not exercise regularly. She tries to watch her diet but does not really keep track of what she eats. She notes that her weight is stable around the 170 pounds. She liked away about 150 pounds. She continues to smoke and has difficulty quitting. She does not think that right now is a good time to quit smoking. She takes medications listed below and feels well. She notes that she's due for a Pap smear today and mammogram in August.   Past Medical History:  Diagnosis Date  . Bipolar disorder ([facility]) 03/23/2014   No past surgical history on file. Social History  Substance Use Topics  . Smoking status: Current Every Day Smoker    Packs/day: 1.00    Years: 26.00    Types: Cigarettes  . Smokeless tobacco: Never Used  . Alcohol use Yes   family history includes Breast cancer in her mother.  ROS as above:  Medications: Current Outpatient Prescriptions  Medication Sig Dispense Refill  . cyclobenzaprine (FLEXERIL) 10 MG tablet Take 0.5-1 tablets (5-10 mg total) by mouth 3 (three) times daily as needed for muscle spasms. 90 tablet 1  . escitalopram (LEXAPRO) 10 MG tablet Take 1 tablet (10 mg total) by mouth daily. 90 tablet 1  . fluconazole (DIFLUCAN) 100 MG tablet Take 1 tablet (100 mg total) by mouth daily. 7 tablet 1  . gabapentin (NEURONTIN) 100 MG capsule TAKE ONE CAPSULE BY MOUTH 3 TIMES A DAY AS NEEDED FOR PAIN 90 capsule 2  . HYDROcodone-acetaminophen (NORCO/VICODIN) 5-325 MG tablet Take 1 tablet by mouth every 6 (six) hours as needed.  0  . QUEtiapine (SEROQUEL XR) 200 MG 24 hr tablet Take 1 tablet (200 mg total) by mouth at bedtime. 90 tablet 1  . topiramate (TOPAMAX) 50 MG tablet Take 1 tablet (50 mg total) by mouth 2  (two) times daily. 180 tablet 1  . traMADol (ULTRAM) 50 MG tablet Take 1 tablet (50 mg total) by mouth every 6 (six) hours as needed. for pain 45 tablet 5   No current facility-administered medications for this visit.    No Known Allergies  Health Maintenance Health Maintenance  Topic Date Due  . PAP SMEAR  04/14/2017  . INFLUENZA VACCINE  07/04/2017  . TETANUS/TDAP  03/21/2026  . HIV Screening  Completed     Exam:  BP 106/77   Pulse (!) 108   Ht 5\' 6"  (1.676 m)   Wt 172 lb (78 kg)   BMI 27.76 kg/m  Gen: Well NAD HEENT: EOMI,  MMM Lungs: Normal work of breathing. CTABL Heart: RRR no MRG Abd: NABS, Soft. Nondistended, Nontender Exts: Brisk capillary refill, warm and well perfused.  GYN: Normal external genitalia. Vaginal canal with thin white discharge normal-appearing cervix nontender.   No results found for this or any previous visit (from the past 72 hour(s)). No results found.    Assessment and Plan: 46 y.o. female with  Well adult. Doing well. Discussed diet and exercise strategies for weight loss. Recommend decreasing smoking to 10 cigarettes per day. Pap smear pending. Recheck annually or sooner if needed.   No orders of the defined types were placed in this encounter.  No orders of the defined types were placed in this encounter.    Discussed warning signs or  symptoms. Please see discharge instructions. Patient expresses understanding.

## 2017-04-24 NOTE — Patient Instructions (Addendum)
Thank you for coming in today. You should hear about the pap smear results in the near future.  Return in 1 year or sooner if needed.   Please log your food for 1 week.  Try to get some exercise.   Work on cutting back to [employer] per day.    Calorie Counting for Weight Loss Calories are units of energy. Your body needs a certain amount of calories from food to keep you going throughout the day. When you eat more calories than your body needs, your body stores the extra calories as fat. When you eat fewer calories than your body needs, your body burns fat to get the energy it needs. Calorie counting means keeping track of how many calories you eat and drink each day. Calorie counting can be helpful if you need to lose weight. If you make sure to eat fewer calories than your body needs, you should lose weight. Ask your health care provider what a healthy weight is for you. For calorie counting to work, you will need to eat the right number of calories in a day in order to lose a healthy amount of weight per week. A dietitian can help you determine how many calories you need in a day and will give you suggestions on how to reach your calorie goal.  A healthy amount of weight to lose per week is usually 1-2 lb (0.5-0.9 kg). This usually means that your daily calorie intake should be reduced by 500-750 calories.  Eating 1,200 - 1,500 calories per day can help most women lose weight.  Eating 1,500 - 1,800 calories per day can help most men lose weight. What is my plan? My goal is to have __________ calories per day. If I have this many calories per day, I should lose around __________ pounds per week. What do I need to know about calorie counting? In order to meet your daily calorie goal, you will need to:  Find out how many calories are in each food you would like to eat. Try to do this before you eat.  Decide how much of the food you plan to eat.  Write down what you ate and how many  calories it had. Doing this is called keeping a food log. To successfully lose weight, it is important to balance calorie counting with a healthy lifestyle that includes regular activity. Aim for 150 minutes of moderate exercise (such as walking) or 75 minutes of vigorous exercise (such as running) each week. Where do I find calorie information?   The number of calories in a food can be found on a Nutrition Facts label. If a food does not have a Nutrition Facts label, try to look up the calories online or ask your dietitian for help. Remember that calories are listed per serving. If you choose to have more than one serving of a food, you will have to multiply the calories per serving by the amount of servings you plan to eat. For example, the label on a package of bread might say that a serving size is 1 slice and that there are 90 calories in a serving. If you eat 1 slice, you will have eaten 90 calories. If you eat 2 slices, you will have eaten 180 calories. How do I keep a food log? Immediately after each meal, record the following information in your food log:  What you ate. Don't forget to include toppings, sauces, and other extras on the food.  How much  you ate. This can be measured in cups, ounces, or number of items.  How many calories each food and drink had.  The total number of calories in the meal. Keep your food log near you, such as in a small notebook in your pocket, or use a mobile app or website. Some programs will calculate calories for you and show you how many calories you have left for the day to meet your goal. What are some calorie counting tips?  Use your calories on foods and drinks that will fill you up and not leave you hungry:  Some examples of foods that fill you up are nuts and nut butters, vegetables, lean proteins, and high-fiber foods like whole grains. High-fiber foods are foods with more than 5 g fiber per serving.  Drinks such as sodas, specialty coffee  drinks, alcohol, and juices have a lot of calories, yet do not fill you up.  Eat nutritious foods and avoid empty calories. Empty calories are calories you get from foods or beverages that do not have many vitamins or protein, such as candy, sweets, and soda. It is better to have a nutritious high-calorie food (such as an avocado) than a food with few nutrients (such as a bag of chips).  Know how many calories are in the foods you eat most often. This will help you calculate calorie counts faster.  Pay attention to calories in drinks. Low-calorie drinks include water and unsweetened drinks.  Pay attention to nutrition labels for "low fat" or "fat free" foods. These foods sometimes have the same amount of calories or more calories than the full fat versions. They also often have added sugar, starch, or salt, to make up for flavor that was removed with the fat.  Find a way of tracking calories that works for you. Get creative. Try different apps or programs if writing down calories does not work for you. What are some portion control tips?  Know how many calories are in a serving. This will help you know how many servings of a certain food you can have.  Use a measuring cup to measure serving sizes. You could also try weighing out portions on a kitchen scale. With time, you will be able to estimate serving sizes for some foods.  Take some time to put servings of different foods on your favorite plates, bowls, and cups so you know what a serving looks like.  Try not to eat straight from a bag or box. Doing this can lead to overeating. Put the amount you would like to eat in a cup or on a plate to make sure you are eating the right portion.  Use smaller plates, glasses, and bowls to prevent overeating.  Try not to multitask (for example, watch TV or use your computer) while eating. If it is time to eat, sit down at a table and enjoy your food. This will help you to know when you are full. It will  also help you to be aware of what you are eating and how much you are eating. What are tips for following this plan? Reading food labels   Check the calorie count compared to the serving size. The serving size may be smaller than what you are used to eating.  Check the source of the calories. Make sure the food you are eating is high in vitamins and protein and low in saturated and trans fats. Shopping   Read nutrition labels while you shop. This will help you  make healthy decisions before you decide to purchase your food.  Make a grocery list and stick to it. Cooking   Try to cook your favorite foods in a healthier way. For example, try baking instead of frying.  Use low-fat dairy products. Meal planning   Use more fruits and vegetables. Half of your plate should be fruits and vegetables.  Include lean proteins like poultry and fish. How do I count calories when eating out?  Ask for smaller portion sizes.  Consider sharing an entree and sides instead of getting your own entree.  If you get your own entree, eat only half. Ask for a box at the beginning of your meal and put the rest of your entree in it so you are not tempted to eat it.  If calories are listed on the menu, choose the lower calorie options.  Choose dishes that include vegetables, fruits, whole grains, low-fat dairy products, and lean protein.  Choose items that are boiled, broiled, grilled, or steamed. Stay away from items that are buttered, battered, fried, or served with cream sauce. Items labeled "crispy" are usually fried, unless stated otherwise.  Choose water, low-fat milk, unsweetened iced tea, or other drinks without added sugar. If you want an alcoholic beverage, choose a lower calorie option such as a glass of wine or light beer.  Ask for dressings, sauces, and syrups on the side. These are usually high in calories, so you should limit the amount you eat.  If you want a salad, choose a garden salad and  ask for grilled meats. Avoid extra toppings like bacon, cheese, or fried items. Ask for the dressing on the side, or ask for olive oil and vinegar or lemon to use as dressing.  Estimate how many servings of a food you are given. For example, a serving of cooked rice is  cup or about the size of half a baseball. Knowing serving sizes will help you be aware of how much food you are eating at restaurants. The list below tells you how big or small some common portion sizes are based on everyday objects:  1 oz-4 stacked dice.  3 oz-1 deck of cards.  1 tsp-1 die.  1 Tbsp- a ping-pong ball.  2 Tbsp-1 ping-pong ball.   cup- baseball.  1 cup-1 baseball. Summary  Calorie counting means keeping track of how many calories you eat and drink each day. If you eat fewer calories than your body needs, you should lose weight.  A healthy amount of weight to lose per week is usually 1-2 lb (0.5-0.9 kg). This usually means reducing your daily calorie intake by 500-750 calories.  The number of calories in a food can be found on a Nutrition Facts label. If a food does not have a Nutrition Facts label, try to look up the calories online or ask your dietitian for help.  Use your calories on foods and drinks that will fill you up, and not on foods and drinks that will leave you hungry.  Use smaller plates, glasses, and bowls to prevent overeating. This information is not intended to replace advice given to you by your health care provider. Make sure you discuss any questions you have with your health care provider. Document Released: 11/20/2005 Document Revised: 10/20/2016 Document Reviewed: 10/20/2016 Elsevier Interactive Patient Education  2017 [employer].

## 2017-05-01 LAB — CYTOLOGY - PAP
Chlamydia: NEGATIVE
DIAGNOSIS: NEGATIVE
HPV (WINDOPATH): NOT DETECTED
Neisseria Gonorrhea: NEGATIVE

## 2017-05-13 ENCOUNTER — Other Ambulatory Visit: Payer: Self-pay | Admitting: Family Medicine

## 2017-06-14 ENCOUNTER — Other Ambulatory Visit: Payer: Self-pay | Admitting: Family Medicine

## 2017-06-14 DIAGNOSIS — F3176 Bipolar disorder, in full remission, most recent episode depressed: Secondary | ICD-10-CM

## 2017-06-19 ENCOUNTER — Ambulatory Visit: Payer: 59 | Admitting: Family Medicine

## 2017-07-09 ENCOUNTER — Other Ambulatory Visit: Payer: Self-pay | Admitting: Family Medicine

## 2017-07-12 ENCOUNTER — Other Ambulatory Visit: Payer: Self-pay | Admitting: Family Medicine

## 2017-08-15 ENCOUNTER — Other Ambulatory Visit: Payer: Self-pay | Admitting: Family Medicine

## 2017-08-30 ENCOUNTER — Encounter: Payer: Self-pay | Admitting: Family Medicine

## 2017-08-30 ENCOUNTER — Ambulatory Visit (INDEPENDENT_AMBULATORY_CARE_PROVIDER_SITE_OTHER): Payer: 59 | Admitting: Family Medicine

## 2017-08-30 VITALS — BP 123/87 | HR 105 | Wt 173.0 lb

## 2017-08-30 DIAGNOSIS — Z23 Encounter for immunization: Secondary | ICD-10-CM

## 2017-08-30 DIAGNOSIS — G894 Chronic pain syndrome: Secondary | ICD-10-CM

## 2017-08-30 DIAGNOSIS — R5383 Other fatigue: Secondary | ICD-10-CM | POA: Diagnosis not present

## 2017-08-30 DIAGNOSIS — F3176 Bipolar disorder, in full remission, most recent episode depressed: Secondary | ICD-10-CM | POA: Diagnosis not present

## 2017-08-30 DIAGNOSIS — M2550 Pain in unspecified joint: Secondary | ICD-10-CM

## 2017-08-30 MED ORDER — QUETIAPINE FUMARATE 50 MG PO TABS: 50.0000 mg | ORAL_TABLET | Freq: Every day | ORAL | 1 refills | Status: DC

## 2017-08-30 NOTE — Progress Notes (Signed)
Aimee Garza is a 46 y.o. female who presents to [facility]: [place] today for discuss fatigue hot flashes and arthralgia.  Over the last 3 months patient has noted hot flashes associated with fatigue and joint pain and swelling. She continues to have menstrual cycles but notes that they are becoming irregular. She denies any fevers or chills but does note some hair changes. She has tried some over-the-counter medicines to combat the pain which has helped only a little. She denies any personal history or family history of rheumatoid arthritis.  Additionally patient notes difficulty falling asleep. She takes extended release Seroquel as part of her regimen for bipolar disorder. She does note this medicine helps her sleep at night. She denies any manic Symptoms such as racing thoughts or significantly mood changes.   Past Medical History:  Diagnosis Date  . Bipolar disorder ([place]) 03/23/2014   No past surgical history on file. Social History  Substance Use Topics  . Smoking status: Current Every Day Smoker    Packs/day: 1.00    Years: 26.00    Types: Cigarettes  . Smokeless tobacco: Never Used  . Alcohol use Yes   family history includes Alcoholism in her unknown relative; Breast cancer in her mother; Diabetes in her unknown relative; Hypertension in her unknown relative.  ROS as above:  Medications: Current Outpatient Prescriptions  Medication Sig Dispense Refill  . cyclobenzaprine (FLEXERIL) 10 MG tablet TAKE 0.5-1 TABLETS (5-10 MG TOTAL) BY MOUTH 3 (THREE) TIMES DAILY AS NEEDED FOR MUSCLE SPASMS. 90 tablet 1  . escitalopram (LEXAPRO) 10 MG tablet TAKE 1 TABLET (10 MG TOTAL) BY MOUTH DAILY. 90 tablet 1  . fluconazole (DIFLUCAN) 100 MG tablet Take 1 tablet (100 mg total) by mouth daily. 7 tablet 1  . gabapentin (NEURONTIN) 100 MG capsule TAKE ONE CAPSULE BY MOUTH 3 TIMES A  DAY AS NEEDED FOR PAIN 90 capsule 2  . QUEtiapine (SEROQUEL XR) 200 MG 24 hr tablet TAKE 1 TABLET (200 MG TOTAL) BY MOUTH AT BEDTIME. 90 tablet 1  . topiramate (TOPAMAX) 50 MG tablet TAKE 1 TABLET (50 MG TOTAL) BY MOUTH 2 (TWO) TIMES DAILY. 180 tablet 1  . traMADol (ULTRAM) 50 MG tablet TAKE 1 TABLET EVERY 6 HOURS AS NEEDED FOR PAIN 45 tablet 0  . QUEtiapine (SEROQUEL) 50 MG tablet Take 1 tablet (50 mg total) by mouth at bedtime. Take at bedtime in addition to extended release quetiapine. 30 tablet 1   No current facility-administered medications for this visit.    No Known Allergies  Health Maintenance Health Maintenance  Topic Date Due  . INFLUENZA VACCINE  07/04/2017  . PAP SMEAR  04/24/2022  . TETANUS/TDAP  03/21/2026  . HIV Screening  Completed     Exam:  BP 123/87   Pulse (!) 105   Wt 173 lb (78.5 kg)   BMI 27.92 kg/m  Gen: Well NAD HEENT: EOMI,  MMM no goiter Lungs: Normal work of breathing. CTABL Heart: RRR no MRG heart rate 90 bpm per my check Abd: NABS, Soft. Nondistended, Nontender Exts: Brisk capillary refill, warm and well perfused.  MSK: No obvious joint effusion. Normal motion. Psych alert and oriented normal speech thought process and affect.   No results found for this or any previous visit (from the past 72 hour(s)). No results found.    Assessment and Plan: 46 y.o. female with  Fatigue: Unclear etiology possibly related to sleep disturbance. Plan for workup  listed below to look for other issues as well. Recheck in about a month.  Arthralgia: Unclear etiology. Plan for rheumatologic workup listed below and follow-up in one month.  Hot flashes: Concerning for perimenopausal symptoms. We'll check estrogen progesterone LH and FSH levels. Follow-up in one month.  Insomnia: We'll use immediate release low-dose Seroquel in conjunction with the extended release at bedtime to help with sleep. Recheck in a month.  Flu vaccine given today.   Orders Placed  This Encounter  Procedures  . Flu Vaccine QUAD 36+ mos IM  . CBC  . COMPLETE METABOLIC PANEL WITH GFR  . TSH  . VITAMIN D 25 Hydroxy (Vit-D Deficiency, Fractures)  . Follicle stimulating hormone  . LH  . Estrogens, total  . Progesterone  . Cyclic citrul peptide antibody, IgG  . Sedimentation rate  . ANA  . CK   Meds ordered this encounter  Medications  . QUEtiapine (SEROQUEL) 50 MG tablet    Sig: Take 1 tablet (50 mg total) by mouth at bedtime. Take at bedtime in addition to extended release quetiapine.    Dispense:  30 tablet    Refill:  1     Discussed warning signs or symptoms. Please see discharge instructions. Patient expresses understanding.  I spent 40 minutes with this patient, greater than 50% was face-to-face time counseling regarding ddx and treatment plan. Marland Kitchen

## 2017-08-30 NOTE — Patient Instructions (Addendum)
Thank you for coming in today. We are going to get labs today.  We are looking for menopause, vit D deficiency and rheumatoid arthritis.   Add 50mg  immediate release Seroquel at bedtime in addition to the extended release form.   Recheck with me in 1 month to follow up the joint pain, fatigue, and hot flashes.  At that time we will also discuss chronic pain more completely.

## 2017-09-03 LAB — COMPLETE METABOLIC PANEL WITH GFR
AG Ratio: 1.8 (calc) (ref 1.0–2.5)
ALKALINE PHOSPHATASE (APISO): 90 U/L (ref 33–115)
ALT: 10 U/L (ref 6–29)
AST: 16 U/L (ref 10–30)
Albumin: 4.5 g/dL (ref 3.6–5.1)
BUN: 9 mg/dL (ref 7–25)
CO2: 24 mmol/L (ref 20–32)
CREATININE: 0.87 mg/dL (ref 0.50–1.10)
Calcium: 9 mg/dL (ref 8.6–10.2)
Chloride: 107 mmol/L (ref 98–110)
GFR, Est African American: 94 mL/min/{1.73_m2} (ref >=60)
GFR, Est Non African American: 81 mL/min/{1.73_m2} (ref >=60)
GLUCOSE: 91 mg/dL (ref 65–99)
Globulin: 2.5 g/dL (calc) (ref 1.9–3.7)
Potassium: 4.2 mmol/L (ref 3.5–5.3)
SODIUM: 140 mmol/L (ref 135–146)
Total Bilirubin: 0.3 mg/dL (ref 0.2–1.2)
Total Protein: 7 g/dL (ref 6.1–8.1)

## 2017-09-03 LAB — LUTEINIZING HORMONE: LH: 13.5 m[IU]/mL

## 2017-09-03 LAB — FOLLICLE STIMULATING HORMONE: FSH: 36.6 m[IU]/mL

## 2017-09-03 LAB — VITAMIN D 25 HYDROXY (VIT D DEFICIENCY, FRACTURES): VIT D 25 HYDROXY: 36 ng/mL (ref 30–100)

## 2017-09-03 LAB — ANA: Anti Nuclear Antibody(ANA): NEGATIVE

## 2017-09-03 LAB — PROGESTERONE

## 2017-09-03 LAB — SEDIMENTATION RATE: SED RATE: 6 mm/h (ref 0–20)

## 2017-09-03 LAB — CBC
HEMATOCRIT: 42.3 % (ref 35.0–45.0)
Hemoglobin: 14.3 g/dL (ref 11.7–15.5)
MCH: 30.8 pg (ref 27.0–33.0)
MCHC: 33.8 g/dL (ref 32.0–36.0)
MCV: 91 fL (ref 80.0–100.0)
MPV: 10.7 fL (ref 7.5–12.5)
PLATELETS: 278 10*3/uL (ref 140–400)
RBC: 4.65 10*6/uL (ref 3.80–5.10)
RDW: 12.9 % (ref 11.0–15.0)
WBC: 4.7 10*3/uL (ref 3.8–10.8)

## 2017-09-03 LAB — ESTROGENS, TOTAL: ESTROGEN: 130.3 pg/mL

## 2017-09-03 LAB — TSH: TSH: 1.09 mIU/L

## 2017-09-03 LAB — CK: CK TOTAL: 52 U/L (ref 29–143)

## 2017-09-03 LAB — CYCLIC CITRUL PEPTIDE ANTIBODY, IGG: Cyclic Citrullin Peptide Ab: <16 UNITS

## 2017-09-14 ENCOUNTER — Other Ambulatory Visit: Payer: Self-pay | Admitting: Family Medicine

## 2017-09-14 ENCOUNTER — Ambulatory Visit (INDEPENDENT_AMBULATORY_CARE_PROVIDER_SITE_OTHER): Payer: 59

## 2017-09-14 DIAGNOSIS — Z1239 Encounter for other screening for malignant neoplasm of breast: Secondary | ICD-10-CM

## 2017-09-14 DIAGNOSIS — R928 Other abnormal and inconclusive findings on diagnostic imaging of breast: Secondary | ICD-10-CM | POA: Diagnosis not present

## 2017-09-17 ENCOUNTER — Other Ambulatory Visit: Payer: Self-pay | Admitting: Family Medicine

## 2017-09-18 ENCOUNTER — Other Ambulatory Visit: Payer: Self-pay | Admitting: Family Medicine

## 2017-09-18 DIAGNOSIS — R928 Other abnormal and inconclusive findings on diagnostic imaging of breast: Secondary | ICD-10-CM

## 2017-09-25 ENCOUNTER — Ambulatory Visit
Admission: RE | Admit: 2017-09-25 | Discharge: 2017-09-25 | Disposition: A | Discharge status: Home / Self Care | Payer: 59 | Source: Ambulatory Visit | Attending: Family Medicine | Admitting: Family Medicine

## 2017-09-25 ENCOUNTER — Ambulatory Visit: Payer: 59

## 2017-09-25 DIAGNOSIS — R928 Other abnormal and inconclusive findings on diagnostic imaging of breast: Secondary | ICD-10-CM

## 2017-09-28 ENCOUNTER — Ambulatory Visit: Payer: 59 | Admitting: Family Medicine

## 2017-09-28 DIAGNOSIS — Z0189 Encounter for other specified special examinations: Secondary | ICD-10-CM

## 2017-09-29 ENCOUNTER — Other Ambulatory Visit: Payer: Self-pay | Admitting: Family Medicine

## 2017-10-02 ENCOUNTER — Encounter: Payer: Self-pay | Admitting: Family Medicine

## 2017-10-02 ENCOUNTER — Ambulatory Visit (INDEPENDENT_AMBULATORY_CARE_PROVIDER_SITE_OTHER): Payer: 59 | Admitting: Family Medicine

## 2017-10-02 VITALS — BP 125/75 | HR 98 | Wt 177.0 lb

## 2017-10-02 DIAGNOSIS — G894 Chronic pain syndrome: Secondary | ICD-10-CM | POA: Diagnosis not present

## 2017-10-02 DIAGNOSIS — F3176 Bipolar disorder, in full remission, most recent episode depressed: Secondary | ICD-10-CM

## 2017-10-02 MED ORDER — TRAMADOL HCL 50 MG PO TABS: 50.0000 mg | ORAL_TABLET | Freq: Four times a day (QID) | ORAL | 4 refills | Status: DC | PRN

## 2017-10-02 MED ORDER — QUETIAPINE FUMARATE 50 MG PO TABS: 50.0000 mg | ORAL_TABLET | Freq: Every day | ORAL | 1 refills | Status: DC

## 2017-10-02 NOTE — Patient Instructions (Addendum)
Thank you for coming in today.  Recheck in 2 months.  Black Cohosh, Cimicifuga racemosa oral dosage forms What is this medicine? BLACK COHOSH (blak KOH hosh) or Cimicifuga racemosa is a dietary supplement. It is promoted to relieve symptoms of menopause, such as hot flashes. The FDA has not approved this supplement for any medical use. This supplement may be used for other purposes; ask your health care provider or pharmacist if you have questions. This medicine may be used for other purposes; ask your health care provider or pharmacist if you have questions. What should I tell my health care provider before I take this medicine? They need to know if you have any of these conditions: -breast cancer -cervical, ovarian or uterine cancer -high blood pressure -infertility -liver disease -menstrual changes or irregular periods -unusual vaginal or uterine bleeding -an unusual or allergic reaction to black cohosh, soybeans, tartrazine dye (yellow dye number 5), other medicines, foods, dyes, or preservatives -pregnant or trying to get pregnant -breast-feeding How should I use this medicine? Take this herb by mouth with a glass of water. Follow the directions on the package labeling, or talk to your health care professional. Do not use for longer than 6 months without the advice of a health care professional. Do not use if you are pregnant or breast-feeding. Talk to your obstetrician-gynecologist or certified nurse-midwife. This herb is not for use in children under the age of 43 years. Overdosage: If you think you have taken too much of this medicine contact a poison control center or emergency room at once. NOTE: This medicine is only for you. Do not share this medicine with others. What if I miss a dose? If you miss a dose, take it as soon as you can. If it is almost time for your next dose, take only that dose. Do not take double or extra doses. What may interact with this  medicine? -atorvastatin -cisplatin -fertility treatments This list may not describe all possible interactions. Give your health care provider a list of all the medicines, herbs, non-prescription drugs, or dietary supplements you use. Also tell them if you smoke, drink alcohol, or use illegal drugs. Some items may interact with your medicine. What should I watch for while using this medicine? Since this herb is derived from a plant, allergic reactions are possible. Stop using this herb if you develop a rash. You may need to see your health care professional, or inform them that this occurred. Report any unusual side effects promptly. If you are taking this herb for menstrual or menopausal symptoms, visit your doctor or health care professional for regular checks on your progress. You should have a complete check-up every 6 months. You will need a regular breast and pelvic exam while on this therapy. Follow the advice of your doctor or health care professional. Women should inform their doctor if they wish to become pregnant or think they might be pregnant. If you have any reason to think you are pregnant, stop taking this herb at once and contact your doctor or health care professional. Herbal or dietary supplements are not regulated like medicines. Rigid quality control standards are not required for dietary supplements. The purity and strength of these products can vary. The safety and effect of this dietary supplement for a certain disease or illness is not well known. This product is not intended to diagnose, treat, cure or prevent any disease. The Food and Drug Administration suggests the following to help consumers protect themselves: -Always read  product labels and follow directions. -Natural does not mean a product is safe for humans to take. -Look for products that include USP after the ingredient name. This means that the manufacturer followed the standards of the [place]  Pharmacopoeia. -Supplements made or sold by a nationally known food or drug company are more likely to be made under tight controls. You can write to the company for more information about how the product was made. What side effects may I notice from receiving this medicine? Side effects that you should report to your doctor or health care professional as soon as possible: -allergic reactions like skin rash, itching or hives, swelling of the face, lips, or tongue -breathing problems -dizziness -palpitations -signs and symptoms of liver injury like dark yellow or brown urine; general ill feeling or flu-like symptoms; light-colored stools; loss of appetite; nausea; right upper belly pain; unusually weak or tired; yellowing of the eyes or skin -unusual vaginal bleeding Side effects that usually do not require medical attention (report to your doctor or health care professional if they continue or are bothersome): -breast tenderness -headache -nausea -upset stomach This list may not describe all possible side effects. Call your doctor for medical advice about side effects. You may report side effects to FDA at 1-800-FDA-1088. Where should I keep my medicine? Keep out of the reach of children. Store at room temperature between 15 and 30 degrees C (59 and 86 degrees C). Throw away any unused herb after the expiration date. NOTE: This sheet is a summary. It may not cover all possible information. If you have questions about this medicine, talk to your doctor, pharmacist, or health care provider.  2018 Elsevier/Gold Standard (2016-05-31 14:35:09)

## 2017-10-02 NOTE — Progress Notes (Signed)
Aimee Garza is a 46 y.o. female who presents to [facility]: [place] today for pain and insomnia.  Insomnia and irritability. Patient was seen last month for fatigue and insomnia. Her Seroquel was increased to include both the extended release in the short release form. She is tolerating this much better and is sleeping a lot better. She's feeling less irritable.  Back pain: Patient has back pain with right-sided lumbar radiculopathy. 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 She is scheduled to have an epidural steroid injection in the near future. She knows this is in addition to her chronic back pain. She's been using tramadol for quite a while with this pain. She notes the 45 tablets of tramadol that she gets a month has been insufficient recently. She notes worsening overall pain. She notes that when she takes tramadol twice daily her pain is better controlled and she is more functional.    Past Medical History:  Diagnosis Date  . Bipolar disorder ([place]) 03/23/2014   No past surgical history on file. Social History  Substance Use Topics  . Smoking status: Current Every Day Smoker    Packs/day: 1.00    Years: 26.00    Types: Cigarettes  . Smokeless tobacco: Never Used  . Alcohol use Yes   family history includes Alcoholism in her unknown relative; Breast cancer in her mother; Diabetes in her unknown relative; Hypertension in her unknown relative.  ROS as above:  Medications: Current Outpatient Prescriptions  Medication Sig Dispense Refill  . cyclobenzaprine (FLEXERIL) 10 MG tablet TAKE 0.5-1 TABLETS (5-10 MG TOTAL) BY MOUTH 3 (THREE) TIMES DAILY AS NEEDED FOR MUSCLE SPASMS. 90 tablet 1  . escitalopram (LEXAPRO) 10 MG tablet TAKE 1 TABLET (10 MG TOTAL) BY MOUTH DAILY. 90 tablet 1  . fluconazole (DIFLUCAN) 100 MG tablet Take 1  tablet (100 mg total) by mouth daily. 7 tablet 1  . gabapentin (NEURONTIN) 100 MG capsule TAKE ONE CAPSULE BY MOUTH 3 TIMES A DAY AS NEEDED FOR PAIN 90 capsule 2  . QUEtiapine (SEROQUEL XR) 200 MG 24 hr tablet TAKE 1 TABLET (200 MG TOTAL) BY MOUTH AT BEDTIME. 90 tablet 1  . QUEtiapine (SEROQUEL) 50 MG tablet Take 1 tablet (50 mg total) by mouth at bedtime. Take at bedtime in addition to extended release quetiapine. 90 tablet 1  . topiramate (TOPAMAX) 50 MG tablet TAKE 1 TABLET (50 MG TOTAL) BY MOUTH 2 (TWO) TIMES DAILY. 180 tablet 1  . traMADol (ULTRAM) 50 MG tablet Take 1 tablet (50 mg total) by mouth every 6 (six) hours as needed. for pain 60 tablet 4   No current facility-administered medications for this visit.    No Known Allergies  Health Maintenance Health Maintenance  Topic Date Due  . PAP SMEAR  04/24/2022  . TETANUS/TDAP  03/21/2026  . INFLUENZA VACCINE  Completed  . HIV Screening  Completed     Exam:  BP 125/75   Pulse 98   Wt 177 lb (80.3 kg)   BMI 28.57 kg/m  Gen: Well NAD HEENT: EOMI,  MMM Lungs: Normal work of breathing. CTABL Heart: RRR no MRG Abd: NABS, Soft. Nondistended, Nontender Exts: Brisk capillary refill, warm and well perfused.  L-spine: Nontender to spinal midline. Tender palpation right lumbar paraspinal muscles. Lower extremitygth is intact normal gait.   No results found for this or any previous visit (from the past 72 hour(s)). No results  found.    Assessment and Plan: 46 y.o. female with  Chronic pain: Worsening control recently. I agree with epidural steroid injection as I think this will be helpful. Plan to increase tramadol to 60 tablets per month. We'll obtain a urine drug screen as well as part of routine follow-up. Recheck in 2 months.  Insomnia better controlled. Plan to continue Seroquel.    Orders Placed This Encounter  Procedures  . Pain Mgmt, Profile 6 Conf w/o mM, U   Meds ordered this encounter  Medications  . traMADol  (ULTRAM) 50 MG tablet    Sig: Take 1 tablet (50 mg total) by mouth every 6 (six) hours as needed. for pain    Dispense:  60 tablet    Refill:  4  . QUEtiapine (SEROQUEL) 50 MG tablet    Sig: Take 1 tablet (50 mg total) by mouth at bedtime. Take at bedtime in addition to extended release quetiapine.    Dispense:  90 tablet    Refill:  1     Discussed warning signs or symptoms. Please see discharge instructions. Patient expresses understanding.  I spent 25 minutes with this patient, greater than 50% was face-to-face time counseling regarding ddx and treatment plan. .  Patient researched [facility] Controlled Substance Reporting System.

## 2017-10-05 LAB — PAIN MGMT, PROFILE 6 CONF W/O MM, U
6 Acetylmorphine: NEGATIVE ng/mL (ref <10)
ALCOHOL METABOLITES: POSITIVE ng/mL — AB (ref <500)
ALPHAHYDROXYALPRAZOLAM: NEGATIVE ng/mL (ref <25)
Alphahydroxymidazolam: NEGATIVE ng/mL (ref <50)
Alphahydroxytriazolam: NEGATIVE ng/mL (ref <50)
Aminoclonazepam: NEGATIVE ng/mL (ref <25)
Amphetamines: NEGATIVE ng/mL (ref <500)
BENZODIAZEPINES: NEGATIVE ng/mL (ref <100)
Barbiturates: NEGATIVE ng/mL (ref <300)
Cocaine Metabolite: NEGATIVE ng/mL (ref <150)
Creatinine: 127 mg/dL
ETHYL GLUCURONIDE (ETG): 8213 ng/mL — AB (ref <500)
Ethyl Sulfate (ETS): 2307 ng/mL — ABNORMAL HIGH (ref <100)
HYDROXYETHYLFLURAZEPAM: NEGATIVE ng/mL (ref <50)
LORAZEPAM: NEGATIVE ng/mL (ref <50)
Marijuana Metabolite: NEGATIVE ng/mL (ref <20)
Methadone Metabolite: NEGATIVE ng/mL (ref <100)
NORDIAZEPAM: NEGATIVE ng/mL (ref <50)
OPIATES: NEGATIVE ng/mL (ref <100)
Oxazepam: NEGATIVE ng/mL (ref <50)
Oxidant: NEGATIVE ug/mL (ref <200)
Oxycodone: NEGATIVE ng/mL (ref <100)
PH: 6.64 (ref 4.5–9.0)
PHENCYCLIDINE: NEGATIVE ng/mL (ref <25)
TEMAZEPAM: NEGATIVE ng/mL (ref <50)

## 2017-10-18 ENCOUNTER — Telehealth: Payer: Self-pay | Admitting: Family Medicine

## 2017-10-18 MED ORDER — QUETIAPINE FUMARATE 50 MG PO TABS: 50.0000 mg | ORAL_TABLET | Freq: Every day | ORAL | 1 refills | Status: DC

## 2017-10-18 NOTE — Telephone Encounter (Signed)
Seroquel 50 refilled

## 2017-12-01 ENCOUNTER — Other Ambulatory Visit: Payer: Self-pay | Admitting: Family Medicine

## 2017-12-11 ENCOUNTER — Encounter: Payer: Self-pay | Admitting: Family Medicine

## 2017-12-11 ENCOUNTER — Ambulatory Visit: Payer: 59 | Admitting: Family Medicine

## 2017-12-11 ENCOUNTER — Other Ambulatory Visit: Payer: Self-pay | Admitting: Family Medicine

## 2017-12-11 VITALS — BP 125/85 | HR 114 | Ht 66.0 in | Wt 181.0 lb

## 2017-12-11 DIAGNOSIS — G8929 Other chronic pain: Secondary | ICD-10-CM

## 2017-12-11 DIAGNOSIS — F3176 Bipolar disorder, in full remission, most recent episode depressed: Secondary | ICD-10-CM

## 2017-12-11 DIAGNOSIS — D229 Melanocytic nevi, unspecified: Secondary | ICD-10-CM | POA: Insufficient documentation

## 2017-12-11 DIAGNOSIS — G894 Chronic pain syndrome: Secondary | ICD-10-CM | POA: Diagnosis not present

## 2017-12-11 DIAGNOSIS — M5441 Lumbago with sciatica, right side: Secondary | ICD-10-CM | POA: Diagnosis not present

## 2017-12-11 NOTE — Patient Instructions (Addendum)
Thank you for coming in today. Recheck with me in 3 months.  Return sooner if needed.  We will check Vit D in about 3 months.  Return sooner if needed.  We will keep an eye on the mole.

## 2017-12-11 NOTE — Progress Notes (Signed)
Aimee Garza is a 47 y.o. female who presents to [facility]: [facility] today for  chronic pain, anxiety, and mood.   Chronic pain: patient had epidural spinal injection with [facility] 1 month ago. They injected L4-L5. Patient had 1 day of pain relief. They plan to re-attempt injection soon. Recently her pain has been increased. She is taking 2-3 tramadol per day. [facility] also prescribed 10 pills 10 mg Norco which she has been splitting in half and taking for breakthrough pain. Counseled on risks and encouraged to minimize use. Will ensure ortho is not prescribing pain medication in future.   Anxiety/mood/insomnia: increased seroquel last visit. Patient also on lexapro. Patient states that anxiety and mood have been very well controlled recently but she still struggles with sleep. Will continue to monitor, but patient states that her mood and anxiety are well controlled so will not change dosage now.  Mole: patient noticed growing mole on right stomach. She states that it is sensitive and has recently become raised.   Past Medical History:  Diagnosis Date  . Bipolar disorder ([place]) 03/23/2014   No past surgical history on file. Social History   Tobacco Use  . Smoking status: Current Every Day Smoker    Packs/day: 1.00    Years: 26.00    Pack years: 26.00    Types: Cigarettes  . Smokeless tobacco: Never Used  Substance Use Topics  . Alcohol use: Yes   family history includes Alcoholism in her unknown relative; Breast cancer in her mother; Diabetes in her unknown relative; Hypertension in her unknown relative.  ROS as above:  Medications: Current Outpatient Medications  Medication Sig Dispense Refill  . cyclobenzaprine (FLEXERIL) 10 MG tablet TAKE 0.5-1 TABLETS (5-10 MG TOTAL) BY MOUTH 3 (THREE) TIMES DAILY AS NEEDED FOR MUSCLE SPASMS. 90 tablet 1  .  escitalopram (LEXAPRO) 10 MG tablet TAKE 1 TABLET (10 MG TOTAL) BY MOUTH DAILY. 90 tablet 1  . fluconazole (DIFLUCAN) 100 MG tablet Take 1 tablet (100 mg total) by mouth daily. 7 tablet 1  . gabapentin (NEURONTIN) 100 MG capsule TAKE ONE CAPSULE BY MOUTH 3 TIMES A DAY AS NEEDED FOR PAIN 90 capsule 2  . HYDROcodone-acetaminophen (NORCO) 10-325 MG tablet TAKE ONE TABLET BY MOUTH TWO TIMES DAY  0  . QUEtiapine (SEROQUEL XR) 200 MG 24 hr tablet TAKE 1 TABLET (200 MG TOTAL) BY MOUTH AT BEDTIME. 90 tablet 1  . QUEtiapine (SEROQUEL) 50 MG tablet Take 1 tablet (50 mg total) at bedtime by mouth. Take at bedtime in addition to extended release quetiapine. 90 tablet 1  . topiramate (TOPAMAX) 50 MG tablet TAKE 1 TABLET (50 MG TOTAL) BY MOUTH 2 (TWO) TIMES DAILY. 180 tablet 1  . traMADol (ULTRAM) 50 MG tablet Take 1 tablet (50 mg total) by mouth every 6 (six) hours as needed. for pain 60 tablet 4   No current facility-administered medications for this visit.    No Known Allergies  Health Maintenance Health Maintenance  Topic Date Due  . PAP SMEAR  04/24/2022  . TETANUS/TDAP  03/21/2026  . INFLUENZA VACCINE  Completed  . HIV Screening  Completed   Exam:  BP 125/85   Pulse (!) 114   Ht 5\' 6"  (1.676 m)   Wt 181 lb (82.1 kg)   BMI 29.21 kg/m  Gen: Well NAD HEENT: EOMI,  MMM Lungs: Normal work of breathing. CTABL Heart: RRR no MRG Abd: NABS, Soft. Nondistended,  Nontender Exts: Brisk capillary refill, warm and well perfused.  Skin: 42mm brown, raised nevus to right of umbilicus. [place], regular borders.      No results found for this or any previous visit (from the past 72 hour(s)). No results found.  Assessment and Plan: 47 y.o. female with chronic low back pain, anxiety, and mood disorder.   Chronic pain: patient should continue to pursue epidural spinal injection with orthopedics. We talked extensively about limiting pain medication as much as possible. She has increased pain and  medication usage slightly since October. Counseled to use gabapentin and flexeril regularly and to limit tramdol and Norco as much as possible.  Anxiety/mood/insomnia: Well managed on lexapro and seroquel. No need to change at this point.  Mole: most consistent with benign nevus. No concern for malignancy at this point. Took picture for documentation and encouraged patient to keep an eye on it. Will monitor and excise if necessary. Excision not indicated at this point.   No orders of the defined types were placed in this encounter.  No orders of the defined types were placed in this encounter.    Discussed warning signs or symptoms. Please see discharge instructions. Patient expresses understanding.

## 2017-12-20 DIAGNOSIS — M5136 Other intervertebral disc degeneration, lumbar region: Secondary | ICD-10-CM | POA: Insufficient documentation

## 2018-01-10 DIAGNOSIS — G8929 Other chronic pain: Secondary | ICD-10-CM

## 2018-01-10 DIAGNOSIS — M545 Low back pain, unspecified: Secondary | ICD-10-CM

## 2018-01-10 DIAGNOSIS — M47816 Spondylosis without myelopathy or radiculopathy, lumbar region: Secondary | ICD-10-CM | POA: Insufficient documentation

## 2018-01-10 HISTORY — DX: Other chronic pain: G89.29

## 2018-01-10 HISTORY — DX: Low back pain, unspecified: M54.50

## 2018-02-05 ENCOUNTER — Other Ambulatory Visit: Payer: Self-pay

## 2018-02-05 DIAGNOSIS — F3176 Bipolar disorder, in full remission, most recent episode depressed: Secondary | ICD-10-CM

## 2018-02-05 MED ORDER — QUETIAPINE FUMARATE ER 200 MG PO TB24: 200.0000 mg | ORAL_TABLET | Freq: Every day | ORAL | 0 refills | Status: DC

## 2018-02-09 ENCOUNTER — Other Ambulatory Visit: Payer: Self-pay | Admitting: Family Medicine

## 2018-02-17 ENCOUNTER — Encounter: Payer: Self-pay | Admitting: Family Medicine

## 2018-02-18 MED ORDER — TRAMADOL HCL 50 MG PO TABS: 50.0000 mg | ORAL_TABLET | Freq: Four times a day (QID) | ORAL | 2 refills | Status: DC | PRN

## 2018-03-07 ENCOUNTER — Other Ambulatory Visit: Payer: Self-pay | Admitting: Family Medicine

## 2018-03-07 DIAGNOSIS — F3176 Bipolar disorder, in full remission, most recent episode depressed: Secondary | ICD-10-CM

## 2018-03-12 ENCOUNTER — Ambulatory Visit: Payer: 59 | Admitting: Family Medicine

## 2018-03-12 ENCOUNTER — Encounter: Payer: Self-pay | Admitting: Family Medicine

## 2018-03-12 VITALS — BP 121/89 | HR 105 | Ht 66.0 in | Wt 171.0 lb

## 2018-03-12 DIAGNOSIS — R5383 Other fatigue: Secondary | ICD-10-CM | POA: Diagnosis not present

## 2018-03-12 DIAGNOSIS — D229 Melanocytic nevi, unspecified: Secondary | ICD-10-CM

## 2018-03-12 DIAGNOSIS — Z6827 Body mass index (BMI) 27.0-27.9, adult: Secondary | ICD-10-CM | POA: Diagnosis not present

## 2018-03-12 DIAGNOSIS — M545 Low back pain: Secondary | ICD-10-CM | POA: Diagnosis not present

## 2018-03-12 DIAGNOSIS — G8929 Other chronic pain: Secondary | ICD-10-CM

## 2018-03-12 DIAGNOSIS — G894 Chronic pain syndrome: Secondary | ICD-10-CM | POA: Diagnosis not present

## 2018-03-12 DIAGNOSIS — Z6828 Body mass index (BMI) 28.0-28.9, adult: Secondary | ICD-10-CM

## 2018-03-12 DIAGNOSIS — F3176 Bipolar disorder, in full remission, most recent episode depressed: Secondary | ICD-10-CM | POA: Diagnosis not present

## 2018-03-12 DIAGNOSIS — Z6830 Body mass index (BMI) 30.0-30.9, adult: Secondary | ICD-10-CM | POA: Insufficient documentation

## 2018-03-12 NOTE — Progress Notes (Signed)
Aimee Garza is a 47 y.o. female who presents to [place]: [place] today for follow up back pain, Bipolar, and nevus.   Back Pain: Aimee Garza has a history of chronic low back pain with occasional radicular pain.  She has been receiving injections Via Dr. Nelva Bush recently.  She recently had a facet injection which has provided excellent pain control.  She continues to use tramadol 1-2 tabs per day which is allowing her to increase her activity level and lose weight.  She feels pretty well and is satisfied with her life is going.  Bipolar disorder: Aimee Garza has bipolar disorder which is currently well managed with Lexapro and Seroquel.  She notes that she is sleeping well and denies any depression or anxiety symptoms.  Nevus: She was seen about 3 months ago for a irritated raised nevus on her left abdomen.  She notes this has become less irritated and has not changed in looks back to normal.   Past Medical History:  Diagnosis Date  . Bipolar disorder ([place]) 03/23/2014  . Chronic low back pain 01/10/2018   No past surgical history on file. Social History   Tobacco Use  . Smoking status: Current Every Day Smoker    Packs/day: 1.00    Years: 26.00    Pack years: 26.00    Types: Cigarettes  . Smokeless tobacco: Never Used  Substance Use Topics  . Alcohol use: Yes   family history includes Alcoholism in her unknown relative; Breast cancer in her mother; Diabetes in her unknown relative; Hypertension in her unknown relative.  ROS as above:  Medications: Current Outpatient Medications  Medication Sig Dispense Refill  . cyclobenzaprine (FLEXERIL) 10 MG tablet TAKE 0.5-1 TABLETS (5-10 MG TOTAL) BY MOUTH 3 (THREE) TIMES DAILY AS NEEDED FOR MUSCLE SPASMS. 90 tablet 1  . escitalopram (LEXAPRO) 10 MG tablet TAKE 1 TABLET (10 MG TOTAL) BY MOUTH DAILY. 90 tablet 0  . gabapentin (NEURONTIN)  100 MG capsule TAKE ONE CAPSULE BY MOUTH 3 TIMES A DAY AS NEEDED FOR PAIN 90 capsule 2  . QUEtiapine (SEROQUEL XR) 200 MG 24 hr tablet Take 1 tablet (200 mg total) by mouth at bedtime. APPOINTMENT NEEDED FOR FURTHER REFILLS 90 tablet 0  . QUEtiapine (SEROQUEL) 50 MG tablet Take 1 tablet (50 mg total) at bedtime by mouth. Take at bedtime in addition to extended release quetiapine. 90 tablet 1  . topiramate (TOPAMAX) 50 MG tablet TAKE 1 TABLET (50 MG TOTAL) BY MOUTH 2 (TWO) TIMES DAILY. 180 tablet 1  . traMADol (ULTRAM) 50 MG tablet Take 1 tablet (50 mg total) by mouth every 6 (six) hours as needed. for pain 60 tablet 2   No current facility-administered medications for this visit.    No Known Allergies  Health Maintenance Health Maintenance  Topic Date Due  . INFLUENZA VACCINE  07/04/2018  . PAP SMEAR  04/24/2022  . TETANUS/TDAP  03/21/2026  . HIV Screening  Completed     Exam:  BP 121/89   Pulse (!) 105   Ht 5\' 6"  (1.676 m)   Wt 171 lb (77.6 kg)   BMI 27.60 kg/m   Wt Readings from Last 5 Encounters:  03/12/18 171 lb (77.6 kg)  12/11/17 181 lb (82.1 kg)  10/02/17 177 lb (80.3 kg)  08/30/17 173 lb (78.5 kg)  04/24/17 172 lb (78 kg)    Gen: Well NAD HEENT: EOMI,  MMM Lungs: Normal work of breathing. CTABL Heart:  RRR no MRG Abd: NABS, Soft. Nondistended, Nontender Exts: Brisk capillary refill, warm and well perfused.  Lspine: Non-tender. Normal motion and gait.  Psych: Alert and oriented.  Normal speech thought process and affect.  No SI or HI expressed. Skin: Flat non-irritated nevus less than 4 mm across with no significant change in pigmentation.  Depression screen [facility] 2/9 03/12/2018 10/02/2017 12/19/2016  Decreased Interest 0 0 0  Down, Depressed, Hopeless 0 0 0  PHQ - 2 Score 0 0 0  Altered sleeping - 0 0  Tired, decreased energy - 2 1  Change in appetite - 2 1  Feeling bad or failure about yourself  - 0 0  Trouble concentrating - 1 0  Moving slowly or  fidgety/restless - 1 0  Suicidal thoughts - 0 0  PHQ-9 Score - 6 2   GAD 7 : Generalized Anxiety Score 03/12/2018 12/19/2016  Nervous, Anxious, on Edge 0 0  Control/stop worrying 0 0  Worry too much - different things 0 1  Trouble relaxing 0 0  Restless 0 1  Easily annoyed or irritable 0 1  Afraid - awful might happen 0 0  Total GAD 7 Score 0 3  Anxiety Difficulty Not difficult at all -     Previous labs reviewed   Assessment and Plan: 47 y.o. female with  Chronic back pain.  Doing well.  Continue tramadol 60 tablets/month.  Medication use agreement clarified.  Refill tramadol as needed.  Expect visits every 3 months for this issue.  Bipolar: Doing quite well continue current regimen.  Nevus benign appearing watchful waiting.  Weight: doing well.  Continue current regimen.  Recheck in the future for wellness visit.   Orders Placed This Encounter  Procedures  . CBC  . COMPLETE METABOLIC PANEL WITH GFR  . Lipid Panel w/reflex Direct LDL  . TSH   No orders of the defined types were placed in this encounter.    Discussed warning signs or symptoms. Please see discharge instructions. Patient expresses understanding.

## 2018-03-12 NOTE — Patient Instructions (Signed)
Thank you for coming in today. Get fasting labs before the next check.  Recheck for well adult visit after May 22nd.   Work on continue exercises.  Consider back yoga or Pilates exercises.  Think about cutting back on smoking to 1/2 pack a day.

## 2018-03-13 ENCOUNTER — Encounter: Payer: Self-pay | Admitting: Family Medicine

## 2018-04-18 ENCOUNTER — Other Ambulatory Visit: Payer: Self-pay | Admitting: Family Medicine

## 2018-04-25 ENCOUNTER — Other Ambulatory Visit: Payer: Self-pay | Admitting: Family Medicine

## 2018-04-25 DIAGNOSIS — F3176 Bipolar disorder, in full remission, most recent episode depressed: Secondary | ICD-10-CM

## 2018-05-07 ENCOUNTER — Encounter: Payer: 59 | Admitting: Family Medicine

## 2018-05-13 ENCOUNTER — Other Ambulatory Visit: Payer: Self-pay | Admitting: Family Medicine

## 2018-05-14 LAB — COMPLETE METABOLIC PANEL WITH GFR
AG RATIO: 1.8 (calc) (ref 1.0–2.5)
ALBUMIN MSPROF: 4.1 g/dL (ref 3.6–5.1)
ALT: 12 U/L (ref 6–29)
AST: 15 U/L (ref 10–35)
Alkaline phosphatase (APISO): 94 U/L (ref 33–115)
BUN: 13 mg/dL (ref 7–25)
CALCIUM: 9 mg/dL (ref 8.6–10.2)
CO2: 26 mmol/L (ref 20–32)
CREATININE: 0.73 mg/dL (ref 0.50–1.10)
Chloride: 104 mmol/L (ref 98–110)
GFR, EST NON AFRICAN AMERICAN: 99 mL/min/{1.73_m2} (ref >=60)
GFR, Est African American: 115 mL/min/{1.73_m2} (ref >=60)
GLOBULIN: 2.3 g/dL (ref 1.9–3.7)
Glucose, Bld: 88 mg/dL (ref 65–99)
POTASSIUM: 4 mmol/L (ref 3.5–5.3)
SODIUM: 139 mmol/L (ref 135–146)
Total Bilirubin: 0.5 mg/dL (ref 0.2–1.2)
Total Protein: 6.4 g/dL (ref 6.1–8.1)

## 2018-05-14 LAB — CBC
HEMATOCRIT: 39.2 % (ref 35.0–45.0)
HEMOGLOBIN: 13.5 g/dL (ref 11.7–15.5)
MCH: 31.3 pg (ref 27.0–33.0)
MCHC: 34.4 g/dL (ref 32.0–36.0)
MCV: 91 fL (ref 80.0–100.0)
MPV: 10.6 fL (ref 7.5–12.5)
Platelets: 241 10*3/uL (ref 140–400)
RBC: 4.31 10*6/uL (ref 3.80–5.10)
RDW: 12.9 % (ref 11.0–15.0)
WBC: 5.4 10*3/uL (ref 3.8–10.8)

## 2018-05-14 LAB — TSH: TSH: 1.9 m[IU]/L

## 2018-05-14 LAB — LIPID PANEL W/REFLEX DIRECT LDL
CHOL/HDL RATIO: 3.4 (calc) (ref <5.0)
Cholesterol: 214 mg/dL — ABNORMAL HIGH (ref <200)
HDL: 63 mg/dL (ref >50)
LDL Cholesterol (Calc): 128 mg/dL (calc) — ABNORMAL HIGH
NON-HDL CHOLESTEROL (CALC): 151 mg/dL — AB (ref <130)
Triglycerides: 118 mg/dL (ref <150)

## 2018-06-04 ENCOUNTER — Ambulatory Visit (INDEPENDENT_AMBULATORY_CARE_PROVIDER_SITE_OTHER): Payer: 59 | Admitting: Family Medicine

## 2018-06-04 ENCOUNTER — Encounter: Payer: Self-pay | Admitting: Family Medicine

## 2018-06-04 VITALS — BP 126/88 | Ht 66.0 in | Wt 177.0 lb

## 2018-06-04 DIAGNOSIS — Z0000 Encounter for general adult medical examination without abnormal findings: Secondary | ICD-10-CM

## 2018-06-04 DIAGNOSIS — Z6828 Body mass index (BMI) 28.0-28.9, adult: Secondary | ICD-10-CM

## 2018-06-04 DIAGNOSIS — R5383 Other fatigue: Secondary | ICD-10-CM | POA: Diagnosis not present

## 2018-06-04 DIAGNOSIS — F3176 Bipolar disorder, in full remission, most recent episode depressed: Secondary | ICD-10-CM

## 2018-06-04 NOTE — Progress Notes (Signed)
Aimee Garza is a 47 y.o. female who presents to [place]: [place] today for well adult visit.  Aimee Garza is doing well overall.  She notes that she has had increased fatigue recently.  She is not sure of the cause that she denies any significant medication change.  She notes some back pain that has been bothering her that is interfering with her sleep.  She does note that she does snore.  She is not sure if she stops breathing at night.  She notes that she will often fall asleep by herself.  She never had a diagnosis of sleep apnea or narcolepsy.  She takes medications listed below.  She does not exercise regularly due to back pain.  She has been trying to eat a careful diet but notes that it is hard at times.  She notes that she smokes but is not quite ready to quit smoking. She notes that she typically does not drink very much but [place]  She will often have more than 4 drinks at a time.  ROS as above:  Past Medical History:  Diagnosis Date  . Bipolar disorder ([place]) 03/23/2014  . Chronic low back pain 01/10/2018   No past surgical history on file. Social History   Tobacco Use  . Smoking status: Current Every Day Smoker    Packs/day: 1.00    Years: 26.00    Pack years: 26.00    Types: Cigarettes  . Smokeless tobacco: Never Used  Substance Use Topics  . Alcohol use: Yes   family history includes Alcoholism in her unknown relative; Breast cancer in her mother; Diabetes in her unknown relative; Hypertension in her unknown relative.  Medications: Current Outpatient Medications  Medication Sig Dispense Refill  . cyclobenzaprine (FLEXERIL) 10 MG tablet TAKE 0.5-1 TABLETS (5-10 MG TOTAL) BY MOUTH 3 (THREE) TIMES DAILY AS NEEDED FOR MUSCLE SPASMS. 90 tablet 1  . escitalopram (LEXAPRO) 10 MG tablet TAKE 1 TABLET (10 MG  TOTAL) BY MOUTH DAILY. 90 tablet 0  . gabapentin (NEURONTIN) 100 MG capsule TAKE ONE CAPSULE BY MOUTH 3 TIMES A DAY AS NEEDED FOR PAIN 90 capsule 2  . QUEtiapine (SEROQUEL XR) 200 MG 24 hr tablet TAKE 1 TABLET (200 MG TOTAL) BY MOUTH AT BEDTIME. APPOINTMENT NEEDED FOR FURTHER REFILLS 90 tablet 0  . QUEtiapine (SEROQUEL) 50 MG tablet Take 1 tablet (50 mg total) at bedtime by mouth. Take at bedtime in addition to extended release quetiapine. 90 tablet 1  . topiramate (TOPAMAX) 50 MG tablet TAKE 1 TABLET (50 MG TOTAL) BY MOUTH 2 (TWO) TIMES DAILY. 180 tablet 1  . traMADol (ULTRAM) 50 MG tablet TAKE 1 TABLET (50 MG TOTAL) BY MOUTH EVERY 6 (SIX) HOURS AS NEEDED. FOR PAIN 60 tablet 2   No current facility-administered medications for this visit.    No Known Allergies  Health Maintenance Health Maintenance  Topic Date Due  . INFLUENZA VACCINE  07/04/2018  . PAP SMEAR  04/24/2022  . TETANUS/TDAP  03/21/2026  . HIV Screening  Completed     Exam:  BP 126/88   Ht 5\' 6"  (1.676 m)   Wt 177 lb (80.3 kg)   BMI 28.57 kg/m  Wt Readings from Last 10 Encounters:  06/04/18 177 lb (80.3 kg)  03/12/18 171 lb (77.6 kg)  12/11/17 181 lb (82.1 kg)  10/02/17 177 lb (80.3 kg)  08/30/17 173  lb (78.5 kg)  04/24/17 172 lb (78 kg)  04/13/17 173 lb (78.5 kg)  12/19/16 172 lb (78 kg)  11/03/16 169 lb (76.7 kg)  06/13/16 173 lb (78.5 kg)      Gen: Well NAD HEENT: EOMI,  MMM Lungs: Normal work of breathing. CTABL Heart: RRR no MRG Abd: NABS, Soft. Nondistended, Nontender Exts: Brisk capillary refill, warm and well perfused.  Psych: Alert and oriented normal speech thought process and affect.  Depression screen [facility] 2/9 06/04/2018 03/12/2018 10/02/2017 12/19/2016  Decreased Interest 0 0 0 0  Down, Depressed, Hopeless 1 0 0 0  PHQ - 2 Score 1 0 0 0  Altered sleeping 0 - 0 0  Tired, decreased energy 1 - 2 1  Change in appetite 0 - 2 1  Feeling bad or failure about yourself  0 - 0 0  Trouble  concentrating 0 - 1 0  Moving slowly or fidgety/restless 0 - 1 0  Suicidal thoughts 0 - 0 0  PHQ-9 Score 2 - 6 2  Difficult doing work/chores Not difficult at all - - -   GAD 7 : Generalized Anxiety Score 06/04/2018 03/12/2018 12/19/2016  Nervous, Anxious, on Edge 0 0 0  Control/stop worrying 0 0 0  Worry too much - different things 0 0 1  Trouble relaxing 0 0 0  Restless 0 0 1  Easily annoyed or irritable 1 0 1  Afraid - awful might happen 0 0 0  Total GAD 7 Score 1 0 3  Anxiety Difficulty Not difficult at all Not difficult at all -     Office Visit from 06/04/2018 in [facility]  AUDIT-C Score  9        EPWORTH 13   STOP BANG: Snore:     Yes Tired:     Yes Observed stop breathing:  no Hypertension:   no  BMI >35:   no Age >50:   no Neck > 16 inches:  no Female gender:   no ------------------------------------------ Total:     2/8    Lab and Radiology Results   Chemistry      Component Value Date/Time   NA 139 05/14/2018 0948   K 4.0 05/14/2018 0948   CL 104 05/14/2018 0948   CO2 26 05/14/2018 0948   BUN 13 05/14/2018 0948   CREATININE 0.73 05/14/2018 0948      Component Value Date/Time   CALCIUM 9.0 05/14/2018 0948   ALKPHOS 72 04/13/2017 1102   AST 15 05/14/2018 0948   ALT 12 05/14/2018 0948   BILITOT 0.5 05/14/2018 0948     Lab Results  Component Value Date   CHOL 214 (H) 05/14/2018   HDL 63 05/14/2018   LDLCALC 128 (H) 05/14/2018   TRIG 118 05/14/2018   CHOLHDL 3.4 05/14/2018      Assessment and Plan: 47 y.o. female with  Well adult visit.  Doing reasonably well.  Patient declines smoking cessation counseling at this time.  Additionally we note that she is drinking a bit more than would be healthy as this also will contribute to weight gain.  Recommend decreasing alcohol intake. Additionally patient has significant fatigue concerning for sleep apnea.  Plan for home sleep study. Labs are up-to-date.  Recheck in  6 months or sooner if needed.    Orders Placed This Encounter  Procedures  . Home sleep test    Standing Status:   Future    Standing Expiration Date:  06/05/2019    Scheduling Instructions:     EPWORTH 13    Order Specific Question:   Where should this test be performed:    Answer:   Other   No orders of the defined types were placed in this encounter.    Discussed warning signs or symptoms. Please see discharge instructions. Patient expresses understanding.

## 2018-06-04 NOTE — Patient Instructions (Addendum)
Thank you for coming in today. You should hear about Sleep Study soon. Let me know if you do not hear anything.  I will get results to you as soon as I get them back.  Continue current medicines.   For exercise water aerobics may be a good idea.  Pilaties is a good idea for back muscle strength Walking is good if you can.   Consider limiting drinks to 2 or less per day.   Recheck in 6 months or sooner if needed.

## 2018-06-06 ENCOUNTER — Other Ambulatory Visit: Payer: Self-pay | Admitting: Family Medicine

## 2018-06-06 DIAGNOSIS — F3176 Bipolar disorder, in full remission, most recent episode depressed: Secondary | ICD-10-CM

## 2018-06-12 ENCOUNTER — Other Ambulatory Visit: Payer: Self-pay | Admitting: Family Medicine

## 2018-06-22 ENCOUNTER — Other Ambulatory Visit: Payer: Self-pay | Admitting: Family Medicine

## 2018-07-30 ENCOUNTER — Other Ambulatory Visit: Payer: Self-pay

## 2018-07-30 DIAGNOSIS — F3176 Bipolar disorder, in full remission, most recent episode depressed: Secondary | ICD-10-CM

## 2018-07-30 NOTE — Telephone Encounter (Signed)
Patient request refill for Seoquel 200 mg 90 day supply . Please send to pharmacy if approved. Rhonda Cunningham,CMA

## 2018-07-31 MED ORDER — QUETIAPINE FUMARATE ER 200 MG PO TB24: 200.0000 mg | ORAL_TABLET | Freq: Every day | ORAL | 1 refills | Status: DC

## 2018-08-14 ENCOUNTER — Other Ambulatory Visit: Payer: Self-pay | Admitting: Family Medicine

## 2018-08-27 ENCOUNTER — Other Ambulatory Visit: Payer: Self-pay | Admitting: Family Medicine

## 2018-09-18 ENCOUNTER — Other Ambulatory Visit: Payer: Self-pay | Admitting: Family Medicine

## 2018-09-18 DIAGNOSIS — F3176 Bipolar disorder, in full remission, most recent episode depressed: Secondary | ICD-10-CM

## 2018-09-29 ENCOUNTER — Other Ambulatory Visit: Payer: Self-pay | Admitting: Family Medicine

## 2018-09-29 DIAGNOSIS — F3176 Bipolar disorder, in full remission, most recent episode depressed: Secondary | ICD-10-CM

## 2018-09-30 ENCOUNTER — Other Ambulatory Visit: Payer: Self-pay | Admitting: Family Medicine

## 2018-09-30 MED ORDER — ESCITALOPRAM OXALATE 10 MG PO TABS
10.0000 mg | ORAL_TABLET | Freq: Every day | ORAL | 0 refills | Status: DC
Start: 2018-09-30 — End: 2019-03-10

## 2018-10-26 ENCOUNTER — Other Ambulatory Visit: Payer: Self-pay | Admitting: Family Medicine

## 2018-11-11 ENCOUNTER — Other Ambulatory Visit: Payer: Self-pay | Admitting: Family Medicine

## 2018-11-30 ENCOUNTER — Other Ambulatory Visit: Payer: Self-pay | Admitting: Family Medicine

## 2018-12-05 ENCOUNTER — Ambulatory Visit: Payer: 59 | Admitting: Family Medicine

## 2018-12-06 ENCOUNTER — Other Ambulatory Visit: Payer: Self-pay | Admitting: Family Medicine

## 2019-01-06 ENCOUNTER — Other Ambulatory Visit: Payer: Self-pay | Admitting: Family Medicine

## 2019-01-29 ENCOUNTER — Other Ambulatory Visit: Payer: Self-pay | Admitting: Family Medicine

## 2019-01-29 DIAGNOSIS — F3176 Bipolar disorder, in full remission, most recent episode depressed: Secondary | ICD-10-CM

## 2019-02-21 ENCOUNTER — Telehealth: Payer: Self-pay | Admitting: Family Medicine

## 2019-02-21 MED ORDER — TRAMADOL HCL 50 MG PO TABS: 50.0000 mg | ORAL_TABLET | Freq: Four times a day (QID) | ORAL | 0 refills | Status: DC | PRN

## 2019-02-21 NOTE — Telephone Encounter (Signed)
Received refill request for tramadol from [facility] pharmacy.  Given COVID-19 pandemic will not require patient to follow-up in clinic for this issue.  We will refill medication and schedule phone visit in the future if needed.  PDMP reviewed during this encounter.

## 2019-02-21 NOTE — Telephone Encounter (Signed)
Pt advised and scheduled for telephone visit.

## 2019-03-10 ENCOUNTER — Encounter: Payer: Self-pay | Admitting: Family Medicine

## 2019-03-10 ENCOUNTER — Ambulatory Visit (INDEPENDENT_AMBULATORY_CARE_PROVIDER_SITE_OTHER): Payer: 59 | Admitting: Family Medicine

## 2019-03-10 ENCOUNTER — Telehealth: Payer: Self-pay | Admitting: Family Medicine

## 2019-03-10 ENCOUNTER — Other Ambulatory Visit: Payer: Self-pay

## 2019-03-10 DIAGNOSIS — N951 Menopausal and female climacteric states: Secondary | ICD-10-CM | POA: Insufficient documentation

## 2019-03-10 DIAGNOSIS — G8929 Other chronic pain: Secondary | ICD-10-CM

## 2019-03-10 DIAGNOSIS — M545 Low back pain: Secondary | ICD-10-CM | POA: Diagnosis not present

## 2019-03-10 DIAGNOSIS — R5383 Other fatigue: Secondary | ICD-10-CM

## 2019-03-10 DIAGNOSIS — G894 Chronic pain syndrome: Secondary | ICD-10-CM

## 2019-03-10 DIAGNOSIS — J3489 Other specified disorders of nose and nasal sinuses: Secondary | ICD-10-CM

## 2019-03-10 DIAGNOSIS — F3176 Bipolar disorder, in full remission, most recent episode depressed: Secondary | ICD-10-CM

## 2019-03-10 DIAGNOSIS — Z803 Family history of malignant neoplasm of breast: Secondary | ICD-10-CM

## 2019-03-10 DIAGNOSIS — R0789 Other chest pain: Secondary | ICD-10-CM

## 2019-03-10 MED ORDER — VENLAFAXINE HCL ER 75 MG PO CP24: 75.0000 mg | ORAL_CAPSULE | Freq: Every day | ORAL | 1 refills | Status: DC

## 2019-03-10 MED ORDER — TRAMADOL HCL 50 MG PO TABS: 50.0000 mg | ORAL_TABLET | Freq: Four times a day (QID) | ORAL | 3 refills | Status: DC | PRN

## 2019-03-10 MED ORDER — ALBUTEROL SULFATE HFA 108 (90 BASE) MCG/ACT IN AERS: 2.0000 | INHALATION_SPRAY | Freq: Four times a day (QID) | RESPIRATORY_TRACT | 0 refills | Status: DC | PRN

## 2019-03-10 NOTE — Progress Notes (Addendum)
Virtual Visit  via Phone Note  I connected with      Aimee Garza by a telemedicine application and verified that I am speaking with the correct person using two identifiers.   I discussed the limitations of evaluation and management by telemedicine and the availability of in person appointments. The patient expressed understanding and agreed to proceed.  History of Present Illness: Aimee Garza is a 48 y.o. female who would like to discuss back pain and mood and fatigue.   Aimee Garza experiences back pain due to degenerative disc disease.  She had a RF ablation in January which worked well.  She notes the pain is slowly returning and she is increasing her use of tramadol.  She had tramadol prescription sent on March 20 and she is been using 1 or 2 tabs a day in addition to Tylenol which works pretty well to control her pain.  She continues her home exercises and activities as tolerated.  She is walking daily.   Additionally Aimee Garza thinks that she is having menopause.  She has not had a menstrual period in over a year and having hot flashes and notes irritability and fatigue.  She think she is getting of sleep and has not changed any of her medications.  She has purchased some over-the-counter estrogen and progesterone topical treatments on [address] and has been using them for about a week and cannot tell any difference.  When asked about her fatigue she notes that she does snore but has never been tested for sleep apnea.  She does feel tired in the mornings.  She has a history of bipolar disorder that is typically pretty well controlled with Lexapro Seroquel and topiramate.  She is never tried Effexor.  Additionally she notes increased allergy symptoms with sneezing and runny nose.  She notes a little bit of chest tightness.  She is taking over-the-counter allergy medications and notes that it helps her nasal symptoms but not her chest symptoms.  She denies frank chest pain or  palpitations.  She denies severe shortness of breath..     Observations/Objective: Exam: Normal Speech.  No shortness of breath tachypnea hoarseness or wheeze or stridor. Psych alert and oriented normal speech thought process and verbal affect.  No SI or HI expressed.  Lab and Radiology Results No results found for this or any previous visit (from the past 72 hour(s)). No results found.   Assessment and Plan: 48 y.o. female with  Chronic pain: Doing well with slight return of pain following RF ablation.  Plan to continue limited tramadol and recheck in 1 month.  Menopausal.  Patient certainly is experiencing symptoms consistent with perimenopause or menopause.  Recommend against plant-based estrogens topically over-the-counter.  Patient has a family history of breast cancer.  Her dominant symptoms are fatigue irritability and hot flashes.  We will try to treat hot flashes with Effexor.  Will discontinue Lexapro temporarily.  Additionally patient has fatigue symptoms.  This is also somewhat concerning for sleep apnea.  Plan for home sleep study in the near future.   Chest tightness: Likely seasonal allergies.  Trial of limited albuterol.  If not better will see in clinic.  Recheck in 1 month.    PDMP reviewed during this encounter. Orders Placed This Encounter  Procedures  . Home sleep test    Standing Status:   Future    Standing Expiration Date:   03/09/2020    Order Specific Question:   Where should this test  be performed:    Answer:   Other   Meds ordered this encounter  Medications  . traMADol (ULTRAM) 50 MG tablet    Sig: Take 1 tablet (50 mg total) by mouth every 6 (six) hours as needed. for pain    Dispense:  60 tablet    Refill:  3  . venlafaxine XR (EFFEXOR XR) 75 MG 24 hr capsule    Sig: Take 1 capsule (75 mg total) by mouth daily with breakfast.    Dispense:  90 capsule    Refill:  1  . albuterol (PROVENTIL HFA;VENTOLIN HFA) 108 (90 Base) MCG/ACT inhaler     Sig: Inhale 2 puffs into the lungs every 6 (six) hours as needed for wheezing or shortness of breath.    Dispense:  1 Inhaler    Refill:  0    Follow Up Instructions:    I discussed the assessment and treatment plan with the patient. The patient was provided an opportunity to ask questions and all were answered. The patient agreed with the plan and demonstrated an understanding of the instructions.   The patient was advised to call back or seek an in-person evaluation if the symptoms worsen or if the condition fails to improve as anticipated.  I provided 25 minutes of non-face-to-face time during this encounter.    Historical information moved to improve visibility of documentation.  Past Medical History:  Diagnosis Date  . Bipolar disorder ([place]) 03/23/2014  . Chronic low back pain 01/10/2018   No past surgical history on file. Social History   Tobacco Use  . Smoking status: Current Every Day Smoker    Packs/day: 1.00    Years: 26.00    Pack years: 26.00    Types: Cigarettes  . Smokeless tobacco: Never Used  Substance Use Topics  . Alcohol use: Yes   family history includes Alcoholism in her unknown relative; Breast cancer in her mother; Diabetes in her unknown relative; Hypertension in her unknown relative.  Medications: Current Outpatient Medications  Medication Sig Dispense Refill  . cyclobenzaprine (FLEXERIL) 10 MG tablet Take 0.5-1 tablets (5-10 mg total) by mouth 3 (three) times daily as needed for muscle spasms. Needs appt for further refills 30 tablet 0  . gabapentin (NEURONTIN) 100 MG capsule TAKE ONE CAPSULE BY MOUTH 3 TIMES A DAY AS NEEDED FOR PAIN 90 capsule 2  . QUEtiapine (SEROQUEL XR) 200 MG 24 hr tablet TAKE 1 TABLET (200 MG TOTAL) BY MOUTH AT BEDTIME. 90 tablet 1  . QUEtiapine (SEROQUEL) 50 MG tablet TAKE 1 TABLET (50 MG TOTAL) AT BEDTIME BY MOUTH. TAKE IN ADDITION TO EXTENDED RELEASE QUETIAPINE. 90 tablet 1  . topiramate (TOPAMAX) 50 MG tablet TAKE 1 TABLET  (50 MG TOTAL) BY MOUTH 2 (TWO) TIMES DAILY. 180 tablet 1  . traMADol (ULTRAM) 50 MG tablet Take 1 tablet (50 mg total) by mouth every 6 (six) hours as needed. for pain 60 tablet 3  . albuterol (PROVENTIL HFA;VENTOLIN HFA) 108 (90 Base) MCG/ACT inhaler Inhale 2 puffs into the lungs every 6 (six) hours as needed for wheezing or shortness of breath. 1 Inhaler 0  . venlafaxine XR (EFFEXOR XR) 75 MG 24 hr capsule Take 1 capsule (75 mg total) by mouth daily with breakfast. 90 capsule 1   No current facility-administered medications for this visit.    No Known Allergies  Addendum to correct incorrect date due to templating error

## 2019-03-10 NOTE — Patient Instructions (Addendum)
Thank you for coming in today.  Continue limited tramadol.    STOP lexapro and switch to Effexor.  Effexor can work for moon and can reduce hot flashes.   I am concerned that the fatigue may be due to sleep apnea.  I will order a home sleep study to look for sleep apnea.   I think it is very likely that you are menopausal.   I would recommend against over the counter estrogens.   Recheck in 1 month.   Venlafaxine extended-release capsules What is this medicine? VENLAFAXINE(VEN la fax een) is used to treat depression, anxiety and panic disorder. This medicine may be used for other purposes; ask your health care provider or pharmacist if you have questions. COMMON BRAND NAME(S): Effexor XR What should I tell my health care provider before I take this medicine? They need to know if you have any of these conditions: -bleeding disorders -glaucoma -heart disease -high blood pressure -high cholesterol -kidney disease -liver disease -low levels of sodium in the blood -mania or bipolar disorder -seizures -suicidal thoughts, plans, or attempt; a previous suicide attempt by you or a family -take medicines that treat or prevent blood clots -thyroid disease -an unusual or allergic reaction to venlafaxine, desvenlafaxine, other medicines, foods, dyes, or preservatives -pregnant or trying to get pregnant -breast-feeding How should I use this medicine? Take this medicine by mouth with a full glass of water. Follow the directions on the prescription label. Do not cut, crush, or chew this medicine. Take it with food. If needed, the capsule may be carefully opened and the entire contents sprinkled on a spoonful of cool applesauce. Swallow the applesauce/pellet mixture right away without chewing and follow with a glass of water to ensure complete swallowing of the pellets. Try to take your medicine at about the same time each day. Do not take your medicine more often than directed. Do not stop  taking this medicine suddenly except upon the advice of your doctor. Stopping this medicine too quickly may cause serious side effects or your condition may worsen. A special MedGuide will be given to you by the pharmacist with each prescription and refill. Be sure to read this information carefully each time. Talk to your pediatrician regarding the use of this medicine in children. Special care may be needed. Overdosage: If you think you have taken too much of this medicine contact a poison control center or emergency room at once. NOTE: This medicine is only for you. Do not share this medicine with others. What if I miss a dose? If you miss a dose, take it as soon as you can. If it is almost time for your next dose, take only that dose. Do not take double or extra doses. What may interact with this medicine? Do not take this medicine with any of the following medications: -certain medicines for fungal infections like fluconazole, itraconazole, ketoconazole, posaconazole, voriconazole -cisapride -desvenlafaxine -dofetilide -dronedarone -duloxetine -levomilnacipran -linezolid -MAOIs like Carbex, Eldepryl, Marplan, Nardil, and Parnate -methylene blue (injected into a vein) -milnacipran -pimozide -thioridazine -ziprasidone This medicine may also interact with the following medications: -amphetamines -aspirin and aspirin-like medicines -certain medicines for depression, anxiety, or psychotic disturbances -certain medicines for migraine headaches like almotriptan, eletriptan, frovatriptan, naratriptan, rizatriptan, sumatriptan, zolmitriptan -certain medicines for sleep -certain medicines that treat or prevent blood clots like dalteparin, enoxaparin, warfarin -cimetidine -clozapine -diuretics -fentanyl -furazolidone -indinavir -isoniazid -lithium -metoprolol -NSAIDS, medicines for pain and inflammation, like ibuprofen or naproxen -other medicines that prolong the QT interval (  cause  an abnormal heart rhythm) -procarbazine -rasagiline -supplements like St. John's wort, kava kava, valerian -tramadol -tryptophan This list may not describe all possible interactions. Give your health care provider a list of all the medicines, herbs, non-prescription drugs, or dietary supplements you use. Also tell them if you smoke, drink alcohol, or use illegal drugs. Some items may interact with your medicine. What should I watch for while using this medicine? Tell your doctor if your symptoms do not get better or if they get worse. Visit your doctor or health care professional for regular checks on your progress. Because it may take several weeks to see the full effects of this medicine, it is important to continue your treatment as prescribed by your doctor. Patients and their families should watch out for new or worsening thoughts of suicide or depression. Also watch out for sudden changes in feelings such as feeling anxious, agitated, panicky, irritable, hostile, aggressive, impulsive, severely restless, overly excited and hyperactive, or not being able to sleep. If this happens, especially at the beginning of treatment or after a change in dose, call your health care professional. This medicine can cause an increase in blood pressure. Check with your doctor for instructions on monitoring your blood pressure while taking this medicine. You may get drowsy or dizzy. Do not drive, use machinery, or do anything that needs mental alertness until you know how this medicine affects you. Do not stand or sit up quickly, especially if you are an older patient. This reduces the risk of dizzy or fainting spells. Alcohol may interfere with the effect of this medicine. Avoid alcoholic drinks. Your mouth may get dry. Chewing sugarless gum, sucking hard candy and drinking plenty of water will help. Contact your doctor if the problem does not go away or is severe. What side effects may I notice from receiving this  medicine? Side effects that you should report to your doctor or health care professional as soon as possible: -allergic reactions like skin rash, itching or hives, swelling of the face, lips, or tongue -anxious -breathing problems -confusion -changes in vision -chest pain -confusion -elevated mood, decreased need for sleep, racing thoughts, impulsive behavior -eye pain -fast, irregular heartbeat -feeling faint or lightheaded, falls -feeling agitated, angry, or irritable -hallucination, loss of contact with reality -high blood pressure -loss of balance or coordination -palpitations -redness, blistering, peeling or loosening of the skin, including inside the mouth -restlessness, pacing, inability to keep still -seizures -stiff muscles -suicidal thoughts or other mood changes -trouble passing urine or change in the amount of urine -trouble sleeping -unusual bleeding or bruising -unusually weak or tired -vomiting Side effects that usually do not require medical attention (report to your doctor or health care professional if they continue or are bothersome): -change in sex drive or performance -change in appetite or weight -constipation -dizziness -dry mouth -headache -increased sweating -nausea -tired This list may not describe all possible side effects. Call your doctor for medical advice about side effects. You may report side effects to FDA at 1-800-FDA-1088. Where should I keep my medicine? Keep out of the reach of children. Store at a controlled temperature between 20 and 25 degrees C (68 degrees and 77 degrees F), in a dry place. Throw away any unused medicine after the expiration date. NOTE: This sheet is a summary. It may not cover all possible information. If you have questions about this medicine, talk to your doctor, pharmacist, or health care provider.  2019 Elsevier/Gold Standard (2016-04-20 18:38:02)  Menopause Menopause is the normal time of life when  menstrual periods stop completely. It is usually confirmed by 12 months without a menstrual period. The transition to menopause (perimenopause) most often happens between the ages of 90 and 69. During perimenopause, hormone levels change in your body, which can cause symptoms and affect your health. Menopause may increase your risk for:  Loss of bone (osteoporosis), which causes bone breaks (fractures).  Depression.  Hardening and narrowing of the arteries (atherosclerosis), which can cause heart attacks and strokes. What are the causes? This condition is usually caused by a natural change in hormone levels that happens as you get older. The condition may also be caused by surgery to remove both ovaries (bilateral oophorectomy). What increases the risk? This condition is more likely to start at an earlier age if you have certain medical conditions or treatments, including:  A tumor of the pituitary gland in the brain.  A disease that affects the ovaries and hormone production.  Radiation treatment for cancer.  Certain cancer treatments, such as chemotherapy or hormone (anti-estrogen) therapy.  Heavy smoking and excessive alcohol use.  Family history of early menopause. This condition is also more likely to develop earlier in women who are very thin. What are the signs or symptoms? Symptoms of this condition include:  Hot flashes.  Irregular menstrual periods.  Night sweats.  Changes in feelings about sex. This could be a decrease in sex drive or an increased comfort around your sexuality.  Vaginal dryness and thinning of the vaginal walls. This may cause painful intercourse.  Dryness of the skin and development of wrinkles.  Headaches.  Problems sleeping (insomnia).  Mood swings or irritability.  Memory problems.  Weight gain.  Hair growth on the face and chest.  Bladder infections or problems with urinating. How is this diagnosed? This condition is diagnosed  based on your medical history, a physical exam, your age, your menstrual history, and your symptoms. Hormone tests may also be done. How is this treated? In some cases, no treatment is needed. You and your health care provider should make a decision together about whether treatment is necessary. Treatment will be based on your individual condition and preferences. Treatment for this condition focuses on managing symptoms. Treatment may include:  Menopausal hormone therapy (MHT).  Medicines to treat specific symptoms or complications.  Acupuncture.  Vitamin or herbal supplements. Before starting treatment, make sure to let your health care provider know if you have a personal or family history of:  Heart disease.  Breast cancer.  Blood clots.  Diabetes.  Osteoporosis. Follow these instructions at home: Lifestyle  Do not use any products that contain nicotine or tobacco, such as cigarettes and e-cigarettes. If you need help quitting, ask your health care provider.  Get at least 30 minutes of physical activity on 5 or more days each week.  Avoid alcoholic and caffeinated beverages, as well as spicy foods. This may help prevent hot flashes.  Get 7-8 hours of sleep each night.  If you have hot flashes, try: ? Dressing in layers. ? Avoiding things that may trigger hot flashes, such as spicy food, warm places, or stress. ? Taking slow, deep breaths when a hot flash starts. ? Keeping a fan in your home and office.  Find ways to manage stress, such as deep breathing, meditation, or journaling.  Consider going to group therapy with other women who are having menopause symptoms. Ask your health care provider about recommended group therapy meetings. Eating  and drinking  Eat a healthy, balanced diet that contains whole grains, lean protein, low-fat dairy, and plenty of fruits and vegetables.  Your health care provider may recommend adding more soy to your diet. Foods that contain  soy include tofu, tempeh, and soy milk.  Eat plenty of foods that contain calcium and vitamin D for bone health. Items that are rich in calcium include low-fat milk, yogurt, beans, almonds, sardines, broccoli, and kale. Medicines  Take over-the-counter and prescription medicines only as told by your health care provider.  Talk with your health care provider before starting any herbal supplements. If prescribed, take vitamins and supplements as told by your health care provider. These may include: ? Calcium. Women age 61 and older should get 1,200 mg (milligrams) of calcium every day. ? Vitamin D. Women need 600-800 International Units of vitamin D each day. ? Vitamins B12 and B6. Aim for 50 micrograms of B12 and 1.5 mg of B6 each day. General instructions  Keep track of your menstrual periods, including: ? When they occur. ? How heavy they are and how long they last. ? How much time passes between periods.  Keep track of your symptoms, noting when they start, how often you have them, and how long they last.  Use vaginal lubricants or moisturizers to help with vaginal dryness and improve comfort during sex.  Keep all follow-up visits as told by your health care provider. This is important. This includes any group therapy or counseling. Contact a health care provider if:  You are still having menstrual periods after age 63.  You have pain during sex.  You have not had a period for 12 months and you develop vaginal bleeding. Get help right away if:  You have: ? Severe depression. ? Excessive vaginal bleeding. ? Pain when you urinate. ? A fast or irregular heart beat (palpitations). ? Severe headaches. ? Abdomen (abdominal) pain or severe indigestion.  You fell and you think you have a broken bone.  You develop leg or chest pain.  You develop vision problems.  You feel a lump in your breast. Summary  Menopause is the normal time of life when menstrual periods stop  completely. It is usually confirmed by 12 months without a menstrual period.  The transition to menopause (perimenopause) most often happens between the ages of 78 and 49.  Symptoms can be managed through medicines, lifestyle changes, and complementary therapies such as acupuncture.  Eat a balanced diet that is rich in nutrients to promote bone health and heart health and to manage symptoms during menopause. This information is not intended to replace advice given to you by your health care provider. Make sure you discuss any questions you have with your health care provider. Document Released: 02/10/2004 Document Revised: 12/23/2016 Document Reviewed: 12/23/2016 Elsevier Interactive Patient Education  2019 [employer].

## 2019-03-10 NOTE — Telephone Encounter (Signed)
Appointment has been made for 04/10/2019. No further questions at this time.

## 2019-03-10 NOTE — Telephone Encounter (Signed)
-----   Message from Gregor Hams, MD sent at 03/10/2019 11:23 AM EDT ----- Regarding: Recheck in 1 month Please schedule 1 month follow for mood, pain and menopause

## 2019-03-18 ENCOUNTER — Other Ambulatory Visit: Payer: Self-pay | Admitting: Family Medicine

## 2019-03-18 DIAGNOSIS — F3176 Bipolar disorder, in full remission, most recent episode depressed: Secondary | ICD-10-CM

## 2019-03-28 ENCOUNTER — Other Ambulatory Visit: Payer: Self-pay | Admitting: Family Medicine

## 2019-04-08 ENCOUNTER — Other Ambulatory Visit: Payer: Self-pay | Admitting: Family Medicine

## 2019-04-10 ENCOUNTER — Ambulatory Visit: Payer: 59 | Admitting: Family Medicine

## 2019-04-15 ENCOUNTER — Encounter: Payer: Self-pay | Admitting: Family Medicine

## 2019-04-17 ENCOUNTER — Other Ambulatory Visit: Payer: Self-pay | Admitting: Family Medicine

## 2019-04-22 ENCOUNTER — Ambulatory Visit (INDEPENDENT_AMBULATORY_CARE_PROVIDER_SITE_OTHER): Payer: 59 | Admitting: Family Medicine

## 2019-04-22 ENCOUNTER — Encounter: Payer: Self-pay | Admitting: Family Medicine

## 2019-04-22 VITALS — BP 118/78 | HR 100 | Ht 66.0 in | Wt 178.0 lb

## 2019-04-22 DIAGNOSIS — F17200 Nicotine dependence, unspecified, uncomplicated: Secondary | ICD-10-CM | POA: Insufficient documentation

## 2019-04-22 DIAGNOSIS — F3176 Bipolar disorder, in full remission, most recent episode depressed: Secondary | ICD-10-CM

## 2019-04-22 DIAGNOSIS — M545 Low back pain, unspecified: Secondary | ICD-10-CM

## 2019-04-22 DIAGNOSIS — G8929 Other chronic pain: Secondary | ICD-10-CM

## 2019-04-22 DIAGNOSIS — G4733 Obstructive sleep apnea (adult) (pediatric): Secondary | ICD-10-CM

## 2019-04-22 DIAGNOSIS — G4730 Sleep apnea, unspecified: Secondary | ICD-10-CM | POA: Insufficient documentation

## 2019-04-22 MED ORDER — VARENICLINE TARTRATE 1 MG PO TABS: 1.0000 mg | ORAL_TABLET | Freq: Two times a day (BID) | ORAL | 3 refills | Status: DC

## 2019-04-22 MED ORDER — CYCLOBENZAPRINE HCL 10 MG PO TABS: 5.0000 mg | ORAL_TABLET | Freq: Three times a day (TID) | ORAL | 12 refills | Status: DC | PRN

## 2019-04-22 MED ORDER — VARENICLINE TARTRATE 0.5 MG X 11 & 1 MG X 42 PO MISC: ORAL | 0 refills | Status: DC

## 2019-04-22 NOTE — Patient Instructions (Signed)
Thank you for coming in today. Ok to start chantix.   Continue Effexor.   Return in 3 months or sooner if needed.    Set a quit date for Chantix.   You can get the coupon on the website for chantix.   [email or web address]://[email or web address]

## 2019-04-22 NOTE — Progress Notes (Signed)
Reports Venlafaxine is working well to help her with menopause. No concerns, just following up for med refills.

## 2019-04-22 NOTE — Progress Notes (Signed)
Virtual Visit  via Video Note  I connected with      Aimee Garza by a video enabled telemedicine application and verified that I am speaking with the correct person using two identifiers.   I discussed the limitations of evaluation and management by telemedicine and the availability of in person appointments. The patient expressed understanding and agreed to proceed.  History of Present Illness: Aimee Garza is a 48 y.o. female who would like to discuss follow-up fatigue and hot flash as well as mood and pain.  Patient was seen over a month ago.  She had been having fatigue and was asked to follow-up for sleep study.  She had a sleep study which showed an AHI of 5.  She is seen by pulmonology and is in the process of being scheduled for a CPAP titration.  Additionally she had hot flashes and slightly worsening mood.  In response to this we will switch from Lexapro to Effexor.  She notes a lot better on Effexor and is happy with how things are going.  She tolerates 75 mg daily quite well.  She notes however her chronic back pain is not improving.  She is taking tramadol typically 2 times daily.  She had an ablation less than 6 months ago and notes worsening pain.  She is in the process of being seen by a second neurosurgeon for second opinion.   She is also interested in quitting smoking.  She like to use Chantix.  She is tried Chantix in the past but gained weight with it.  She like to try it again if possible.  She notes that she has not done well with nicotine replacement products.   Observations/Objective: BP 118/78   Pulse 100   Ht 5\' 6"  (1.676 m)   Wt 178 lb (80.7 kg)   BMI 28.73 kg/m  Wt Readings from Last 5 Encounters:  04/22/19 178 lb (80.7 kg)  06/04/18 177 lb (80.3 kg)  03/12/18 171 lb (77.6 kg)  12/11/17 181 lb (82.1 kg)  10/02/17 177 lb (80.3 kg)   Exam: Appearance nontoxic no acute distress Normal Speech.  Psych alert and oriented normal speech thought process  and affect.   Depression screen [facility] 2/9 04/22/2019 06/04/2018 03/12/2018 10/02/2017 12/19/2016  Decreased Interest 0 0 0 0 0  Down, Depressed, Hopeless 0 1 0 0 0  PHQ - 2 Score 0 1 0 0 0  Altered sleeping 0 0 - 0 0  Tired, decreased energy 1 1 - 2 1  Change in appetite 0 0 - 2 1  Feeling bad or failure about yourself  0 0 - 0 0  Trouble concentrating 0 0 - 1 0  Moving slowly or fidgety/restless 0 0 - 1 0  Suicidal thoughts 0 0 - 0 0  PHQ-9 Score 1 2 - 6 2  Difficult doing work/chores Not difficult at all Not difficult at all - - -   GAD 7 : Generalized Anxiety Score 04/22/2019 06/04/2018 03/12/2018 12/19/2016  Nervous, Anxious, on Edge 0 0 0 0  Control/stop worrying 0 0 0 0  Worry too much - different things 0 0 0 1  Trouble relaxing 1 0 0 0  Restless 1 0 0 1  Easily annoyed or irritable 1 1 0 1  Afraid - awful might happen 0 0 0 0  Total GAD 7 Score 3 1 0 3  Anxiety Difficulty Not difficult at all Not difficult at all Not difficult at all -  Lab and Radiology Results No results found for this or any previous visit (from the past 72 hour(s)). No results found.   Assessment and Plan: 48 y.o. female with  Hot flashes: Much improved with Effexor.  Continue current regimen.  Mood: Also improved continue Effexor.  Fatigue: New diagnosis of obstructive sleep apnea.  Plan for CPAP titration per pulmonology.  Chronic pain: Continue limited tramadol.  Repeat ablation in few months.  Agree with second opinion.  Smoking cessation counseling: Discussed options.  Plan for trial of Chantix.  Recheck in about 3 months.  PDMP not reviewed this encounter. No orders of the defined types were placed in this encounter.  Meds ordered this encounter  Medications  . cyclobenzaprine (FLEXERIL) 10 MG tablet    Sig: Take 0.5-1 tablets (5-10 mg total) by mouth 3 (three) times daily as needed for muscle spasms.    Dispense:  90 tablet    Refill:  12  . varenicline (CHANTIX STARTING MONTH PAK) 0.5  MG X 11 & 1 MG X 42 tablet    Sig: Take one 0.5mg  tablet by mouth once daily for 3 days, then increase to one 0.5mg  tablet twice daily for 3 days, then increase to one 1mg  tablet twice daily.    Dispense:  53 tablet    Refill:  0  . varenicline (CHANTIX) 1 MG tablet    Sig: Take 1 tablet (1 mg total) by mouth 2 (two) times daily.    Dispense:  60 tablet    Refill:  3    Follow Up Instructions:    I discussed the assessment and treatment plan with the patient. The patient was provided an opportunity to ask questions and all were answered. The patient agreed with the plan and demonstrated an understanding of the instructions.   The patient was advised to call back or seek an in-person evaluation if the symptoms worsen or if the condition fails to improve as anticipated.  Time: 15 minutes of intraservice time, with >22 minutes of total time during today's visit.      Historical information moved to improve visibility of documentation.  Past Medical History:  Diagnosis Date  . Bipolar disorder ([place]) 03/23/2014  . Chronic low back pain 01/10/2018   No past surgical history on file. Social History   Tobacco Use  . Smoking status: Current Every Day Smoker    Packs/day: 1.00    Years: 26.00    Pack years: 26.00    Types: Cigarettes  . Smokeless tobacco: Never Used  Substance Use Topics  . Alcohol use: Yes   family history includes Alcoholism in her unknown relative; Breast cancer in her mother; Diabetes in her unknown relative; Hypertension in her unknown relative.  Medications: Current Outpatient Medications  Medication Sig Dispense Refill  . albuterol (VENTOLIN HFA) 108 (90 Base) MCG/ACT inhaler TAKE 2 PUFFS BY MOUTH EVERY 6 HOURS AS NEEDED FOR WHEEZE OR SHORTNESS OF BREATH 18 Inhaler 0  . cyclobenzaprine (FLEXERIL) 10 MG tablet Take 0.5-1 tablets (5-10 mg total) by mouth 3 (three) times daily as needed for muscle spasms. 90 tablet 12  . gabapentin (NEURONTIN) 100 MG capsule  TAKE ONE CAPSULE BY MOUTH 3 TIMES A DAY AS NEEDED FOR PAIN 90 capsule 2  . QUEtiapine (SEROQUEL XR) 200 MG 24 hr tablet TAKE 1 TABLET (200 MG TOTAL) BY MOUTH AT BEDTIME. 90 tablet 1  . QUEtiapine (SEROQUEL) 50 MG tablet TAKE 1 TABLET (50 MG TOTAL) AT BEDTIME BY MOUTH. TAKE IN ADDITION  TO EXTENDED RELEASE QUETIAPINE. 90 tablet 1  . topiramate (TOPAMAX) 50 MG tablet TAKE 1 TABLET (50 MG TOTAL) BY MOUTH 2 (TWO) TIMES DAILY. 180 tablet 1  . traMADol (ULTRAM) 50 MG tablet Take 1 tablet (50 mg total) by mouth every 6 (six) hours as needed. for pain 60 tablet 3  . venlafaxine XR (EFFEXOR XR) 75 MG 24 hr capsule Take 1 capsule (75 mg total) by mouth daily with breakfast. 90 capsule 1  . varenicline (CHANTIX STARTING MONTH PAK) 0.5 MG X 11 & 1 MG X 42 tablet Take one 0.5mg  tablet by mouth once daily for 3 days, then increase to one 0.5mg  tablet twice daily for 3 days, then increase to one 1mg  tablet twice daily. 53 tablet 0  . [START ON 05/23/2019] varenicline (CHANTIX) 1 MG tablet Take 1 tablet (1 mg total) by mouth 2 (two) times daily. 60 tablet 3   No current facility-administered medications for this visit.    No Known Allergies

## 2019-06-02 ENCOUNTER — Other Ambulatory Visit: Payer: Self-pay | Admitting: Family Medicine

## 2019-06-05 ENCOUNTER — Other Ambulatory Visit: Payer: Self-pay | Admitting: Family Medicine

## 2019-06-10 ENCOUNTER — Telehealth: Payer: Self-pay | Admitting: Family Medicine

## 2019-06-10 ENCOUNTER — Encounter: Payer: Self-pay | Admitting: Family Medicine

## 2019-06-10 NOTE — Telephone Encounter (Signed)
Received records request from [place] at [facility] in [place].  Requesting records from 2008.  I have printed and provided records from 2016 and sooner as that is all I have.

## 2019-07-09 ENCOUNTER — Other Ambulatory Visit: Payer: Self-pay | Admitting: Family Medicine

## 2019-07-09 DIAGNOSIS — F3176 Bipolar disorder, in full remission, most recent episode depressed: Secondary | ICD-10-CM

## 2019-07-11 ENCOUNTER — Other Ambulatory Visit: Payer: Self-pay | Admitting: Family Medicine

## 2019-08-08 ENCOUNTER — Other Ambulatory Visit: Payer: Self-pay | Admitting: Family Medicine

## 2019-08-17 LAB — HEPATIC FUNCTION PANEL
ALT: 18 (ref 7–35)
AST: 19 (ref 13–35)
Alkaline Phosphatase: 102 (ref 25–125)
Bilirubin, Total: 0.4

## 2019-08-17 LAB — BASIC METABOLIC PANEL
BUN: 9 (ref 4–21)
Creatinine: 0.9 (ref 0.5–1.1)
Glucose: 102
Potassium: 4 (ref 3.4–5.3)
Sodium: 136 — AB (ref 137–147)

## 2019-09-01 ENCOUNTER — Other Ambulatory Visit: Payer: Self-pay | Admitting: Family Medicine

## 2019-09-01 DIAGNOSIS — Z1231 Encounter for screening mammogram for malignant neoplasm of breast: Secondary | ICD-10-CM

## 2019-09-02 ENCOUNTER — Ambulatory Visit (INDEPENDENT_AMBULATORY_CARE_PROVIDER_SITE_OTHER): Payer: 59 | Admitting: Family Medicine

## 2019-09-02 ENCOUNTER — Encounter: Payer: Self-pay | Admitting: Family Medicine

## 2019-09-02 VITALS — BP 122/86 | HR 97 | Temp 98.7°F | Wt 190.0 lb

## 2019-09-02 DIAGNOSIS — Z0000 Encounter for general adult medical examination without abnormal findings: Secondary | ICD-10-CM | POA: Diagnosis not present

## 2019-09-02 DIAGNOSIS — R1013 Epigastric pain: Secondary | ICD-10-CM | POA: Diagnosis not present

## 2019-09-02 DIAGNOSIS — Z23 Encounter for immunization: Secondary | ICD-10-CM | POA: Diagnosis not present

## 2019-09-02 DIAGNOSIS — E785 Hyperlipidemia, unspecified: Secondary | ICD-10-CM | POA: Diagnosis not present

## 2019-09-02 DIAGNOSIS — Z780 Asymptomatic menopausal state: Secondary | ICD-10-CM | POA: Diagnosis not present

## 2019-09-02 DIAGNOSIS — Z6830 Body mass index (BMI) 30.0-30.9, adult: Secondary | ICD-10-CM

## 2019-09-02 DIAGNOSIS — F17200 Nicotine dependence, unspecified, uncomplicated: Secondary | ICD-10-CM

## 2019-09-02 MED ORDER — OMEPRAZOLE 40 MG PO CPDR: 40.0000 mg | DELAYED_RELEASE_CAPSULE | Freq: Every day | ORAL | 3 refills | Status: DC

## 2019-09-02 NOTE — Progress Notes (Signed)
Aimee Garza is a 48 y.o. female who presents to [facility] today for well adult visit.  She is doing pretty well overall.  She was seen in the emergency room for abdominal pain about 2 weeks ago.  Labs were effectively unremarkable as was EKG.  Since then she has felt pretty well without another episode of significant epigastric abdominal pain.  She does have bothersome acid reflux which she takes over-the-counter Pepcid Tums and Prilosec occasionally.  Patient was seen earlier this year for hot flashes and mood disorder.  She was switched to Effexor which has improved both mood and the hot flashes when last assessed May.  Additionally she was found to have sleep apnea and is in the process of CPAP titration per pulmonology.  Chronic pain: Managed with tramadol.  Tobacco: Quit smoking with Chantix after visit in June.  Restarted after her mother-in-law died but only smoking about 1 cigarette/day.  Happy with how Chantix is working.  Pap smear last completed 2018 normal.  Mammogram scheduled for next month.  DEXA scan not scheduled.  .  ROS as above:  Past Medical History:  Diagnosis Date  . Bipolar disorder ([place]) 03/23/2014  . Chronic low back pain 01/10/2018   No past surgical history on file. Social History   Tobacco Use  . Smoking status: Current Every Day Smoker    Packs/day: 1.00    Years: 26.00    Pack years: 26.00    Types: Cigarettes  . Smokeless tobacco: Never Used  Substance Use Topics  . Alcohol use: Yes   family history includes Alcoholism in her unknown relative; Breast cancer in her mother; Diabetes in her unknown relative; Hypertension in her unknown relative.  Medications: Current Outpatient Medications  Medication Sig Dispense Refill  . albuterol (VENTOLIN HFA) 108 (90 Base) MCG/ACT inhaler TAKE 2 PUFFS BY MOUTH EVERY 6 HOURS AS NEEDED  FOR WHEEZE OR SHORTNESS OF BREATH 18 Inhaler 0  . cyclobenzaprine (FLEXERIL) 10 MG tablet Take 0.5-1 tablets (5-10 mg total) by mouth 3 (three) times daily as needed for muscle spasms. 90 tablet 12  . gabapentin (NEURONTIN) 100 MG capsule TAKE ONE CAPSULE BY MOUTH 3 TIMES A DAY AS NEEDED FOR PAIN 90 capsule 2  . QUEtiapine (SEROQUEL XR) 200 MG 24 hr tablet TAKE 1 TABLET (200 MG TOTAL) BY MOUTH AT BEDTIME. 90 tablet 1  . QUEtiapine (SEROQUEL) 50 MG tablet TAKE 1 TABLET (50 MG TOTAL) AT BEDTIME BY MOUTH. TAKE IN ADDITION TO EXTENDED RELEASE QUETIAPINE. 90 tablet 0  . topiramate (TOPAMAX) 50 MG tablet TAKE 1 TABLET (50 MG TOTAL) BY MOUTH 2 (TWO) TIMES DAILY. 180 tablet 1  . traMADol (ULTRAM) 50 MG tablet TAKE 1 TABLET (50 MG TOTAL) BY MOUTH EVERY 6 (SIX) HOURS AS NEEDED. FOR PAIN 60 tablet 3  . varenicline (CHANTIX) 1 MG tablet Take 1 tablet (1 mg total) by mouth 2 (two) times daily. 60 tablet 3  . venlafaxine XR (EFFEXOR-XR) 75 MG 24 hr capsule TAKE 1 CAPSULE (75 MG TOTAL) BY MOUTH DAILY WITH BREAKFAST. 90 capsule 1  . omeprazole (PRILOSEC) 40 MG capsule Take 1 capsule (40 mg total) by mouth daily. 90 capsule 3   No current facility-administered medications for this visit.    No Known Allergies  Health Maintenance Health Maintenance  Topic Date Due  . INFLUENZA VACCINE  07/05/2019  . PAP SMEAR-Modifier  04/24/2022  . TETANUS/TDAP  03/21/2026  . HIV  Screening  Completed     Exam:  BP 122/86   Pulse 97   Temp 98.7 F (37.1 C) (Oral)   Wt 190 lb (86.2 kg)   BMI 30.67 kg/m  Wt Readings from Last 5 Encounters:  09/02/19 190 lb (86.2 kg)  04/22/19 178 lb (80.7 kg)  06/04/18 177 lb (80.3 kg)  03/12/18 171 lb (77.6 kg)  12/11/17 181 lb (82.1 kg)      Gen: Well NAD HEENT: EOMI,  MMM Lungs: Normal work of breathing. CTABL Heart: RRR no MRG Abd: NABS, Soft. Nondistended, Nontender Exts: Brisk capillary refill, warm and well perfused.  Psych: Alert and oriented normal speech  thought process and affect.  Depression screen [facility] 2/9 09/02/2019 04/22/2019 06/04/2018 03/12/2018 10/02/2017  Decreased Interest 0 0 0 0 0  Down, Depressed, Hopeless 1 0 1 0 0  PHQ - 2 Score 1 0 1 0 0  Altered sleeping 1 0 0 - 0  Tired, decreased energy 0 1 1 - 2  Change in appetite 0 0 0 - 2  Feeling bad or failure about yourself  0 0 0 - 0  Trouble concentrating 0 0 0 - 1  Moving slowly or fidgety/restless 0 0 0 - 1  Suicidal thoughts 0 0 0 - 0  PHQ-9 Score 2 1 2  - 6  Difficult doing work/chores Not difficult at all Not difficult at all Not difficult at all - -       Lab and Radiology Results No results found for this or any previous visit (from the past 72 hour(s)). No results found.    Assessment and Plan: 48 y.o. female with  Well adult.  Doing reasonably well.  Main new issue currently is the episode of abdominal pain that she had.  This certainly could be gastritis or acid reflux related or possibly gallstone related.  Plan for trial of omeprazole along with abdominal ultrasound.  Smoking: Patient has reduced smoking quite a bit.  Continue to work on abstinence.  Continue Chantix.  Hyperlipidemia: LDL elevated at last lab.  Plan to recheck fasting lipids in near future.  Health maintenance: Pap smear completed.  Mammogram scheduled.  DEXA scan ordered today probably could be done at same time as mammogram. Flu vaccine given today prior to discharge.  Recheck in 6 to 12 months or sooner if needed.   I informed patient that I am transitioning to sports medicine only [facility] in [place] starting in November.  Happy to see patient for continued sports medicine needs.  Discussed need for new PCP.  Provided some recommendations.   PDMP not reviewed this encounter. Orders Placed This Encounter  Procedures  . DG Bone Density    Standing Status:   Future    Standing Expiration Date:   11/01/2020    Order Specific Question:   Reason for Exam (SYMPTOM  OR  DIAGNOSIS REQUIRED)    Answer:   eval bone density post menopause    Order Specific Question:   Is the patient pregnant?    Answer:   No    Order Specific Question:   Preferred imaging location?    Answer:   [facility]  . US Abdomen Complete    Standing Status:   Future    Standing Expiration Date:   11/01/2020    Order Specific Question:   Reason for Exam (SYMPTOM  OR DIAGNOSIS REQUIRED)    Answer:   hx epigastric abd pain. Eval gallstone    Order  Specific Question:   Preferred imaging location?    Answer:   [facility]  . Flu Vaccine QUAD 36+ mos IM  . Basic metabolic panel    This external order was created through the Results Console.  . Hepatic function panel    This external order was created through the Results Console.  . Lipid Panel w/reflex Direct LDL   Meds ordered this encounter  Medications  . omeprazole (PRILOSEC) 40 MG capsule    Sig: Take 1 capsule (40 mg total) by mouth daily.    Dispense:  90 capsule    Refill:  3     Discussed warning signs or symptoms. Please see discharge instructions. Patient expresses understanding.

## 2019-09-02 NOTE — Patient Instructions (Addendum)
Thank you for coming in today. Continue current medicine.  Get fasting cholesterol labs soon.  Go xray and schedule Ultrasound and Bone Density test.  Work on quitting smoking.  Start omeprazole daily for acid reflux.  Recheck in 6-12 months if all is well.    I will be moving to full time Sports Medicine in [place] starting on November 1st.  You will still be able to see me for your Sports Medicine or Orthopedic needs at [facility] in [place]. I will still be part of [facility].    If you want to stay locally for your Sports Medicine issues Dr. Dianah Field here in [place] will be happy to see you.  Additionally Dr. Clearance Coots at [facility] will be happy to see you for sports medicine issues more locally.   For your primary care needs you are welcome to establish care with Dr. Emeterio Reeve.  We are working quickly to hire more physicians to cover the primary care needs however if you cannot get an appointment with Dr. Sheppard Coil in a timely manner [facility] has locations and openings for primary care services nearby.   [facility] at [facility] [address] . [place] , [place]: [phone] . Behavioral Medicine: [phone] . Fax: [place] at [employer] [address] . [place], [place]: [phone] . Behavioral Medicine: [phone] . Fax: [phone] . Hours (M-F): 7am - Academic librarian [facility] [address] [place], [place]: [phone] . Behavioral Medicine: [phone] . Fax: [phone] . Hours (M-F): 8am - Optician, dispensing at [employer] . [place], [place] Phone: [phone] . Behavioral Medicine: [phone] . Fax: [phone]

## 2019-09-04 ENCOUNTER — Ambulatory Visit (INDEPENDENT_AMBULATORY_CARE_PROVIDER_SITE_OTHER): Payer: 59

## 2019-09-04 ENCOUNTER — Other Ambulatory Visit: Payer: Self-pay

## 2019-09-04 DIAGNOSIS — R1013 Epigastric pain: Secondary | ICD-10-CM | POA: Diagnosis not present

## 2019-09-05 ENCOUNTER — Encounter: Payer: Self-pay | Admitting: Family Medicine

## 2019-09-05 ENCOUNTER — Telehealth: Payer: Self-pay | Admitting: Family Medicine

## 2019-09-05 LAB — LIPID PANEL W/REFLEX DIRECT LDL
Cholesterol: 215 mg/dL — ABNORMAL HIGH (ref <200)
HDL: 51 mg/dL (ref >=50)
LDL Cholesterol (Calc): 130 mg/dL (calc) — ABNORMAL HIGH
Non-HDL Cholesterol (Calc): 164 mg/dL (calc) — ABNORMAL HIGH (ref <130)
Total CHOL/HDL Ratio: 4.2 (calc) (ref <5.0)
Triglycerides: 206 mg/dL — ABNORMAL HIGH (ref <150)

## 2019-09-10 ENCOUNTER — Other Ambulatory Visit: Payer: Self-pay | Admitting: Family Medicine

## 2019-09-16 ENCOUNTER — Ambulatory Visit (HOSPITAL_BASED_OUTPATIENT_CLINIC_OR_DEPARTMENT_OTHER)
Admission: RE | Admit: 2019-09-16 | Discharge: 2019-09-16 | Disposition: A | Discharge status: Home / Self Care | Payer: 59 | Source: Ambulatory Visit | Attending: Family Medicine | Admitting: Family Medicine

## 2019-09-16 ENCOUNTER — Other Ambulatory Visit: Payer: Self-pay

## 2019-09-16 DIAGNOSIS — Z780 Asymptomatic menopausal state: Secondary | ICD-10-CM | POA: Insufficient documentation

## 2019-09-18 ENCOUNTER — Other Ambulatory Visit: Payer: Self-pay

## 2019-09-18 ENCOUNTER — Ambulatory Visit (INDEPENDENT_AMBULATORY_CARE_PROVIDER_SITE_OTHER): Payer: 59

## 2019-09-18 ENCOUNTER — Encounter: Payer: Self-pay | Admitting: Family Medicine

## 2019-09-18 DIAGNOSIS — Z1231 Encounter for screening mammogram for malignant neoplasm of breast: Secondary | ICD-10-CM

## 2019-09-30 ENCOUNTER — Encounter: Payer: Self-pay | Admitting: Family Medicine

## 2019-10-28 NOTE — Telephone Encounter (Signed)
Note opened in error.

## 2019-11-07 ENCOUNTER — Other Ambulatory Visit: Payer: Self-pay | Admitting: Family Medicine

## 2019-12-05 ENCOUNTER — Other Ambulatory Visit: Payer: Self-pay | Admitting: Family Medicine

## 2019-12-14 ENCOUNTER — Other Ambulatory Visit: Payer: Self-pay | Admitting: Sports Medicine

## 2019-12-30 ENCOUNTER — Other Ambulatory Visit: Payer: Self-pay | Admitting: Family Medicine

## 2019-12-31 ENCOUNTER — Other Ambulatory Visit: Payer: Self-pay | Admitting: Family Medicine

## 2019-12-31 DIAGNOSIS — F3176 Bipolar disorder, in full remission, most recent episode depressed: Secondary | ICD-10-CM

## 2020-01-05 NOTE — Telephone Encounter (Signed)
Must make appointment 

## 2020-01-06 ENCOUNTER — Encounter: Payer: Self-pay | Admitting: Family Medicine

## 2020-01-06 ENCOUNTER — Other Ambulatory Visit: Payer: Self-pay | Admitting: Family Medicine

## 2020-01-06 DIAGNOSIS — F3176 Bipolar disorder, in full remission, most recent episode depressed: Secondary | ICD-10-CM

## 2020-01-09 ENCOUNTER — Other Ambulatory Visit: Payer: Self-pay

## 2020-01-09 ENCOUNTER — Encounter: Payer: Self-pay | Admitting: Family Medicine

## 2020-01-09 ENCOUNTER — Ambulatory Visit: Payer: 59 | Admitting: Family Medicine

## 2020-01-09 DIAGNOSIS — G8929 Other chronic pain: Secondary | ICD-10-CM

## 2020-01-09 DIAGNOSIS — F3176 Bipolar disorder, in full remission, most recent episode depressed: Secondary | ICD-10-CM | POA: Diagnosis not present

## 2020-01-09 DIAGNOSIS — M545 Low back pain, unspecified: Secondary | ICD-10-CM

## 2020-01-09 DIAGNOSIS — G4733 Obstructive sleep apnea (adult) (pediatric): Secondary | ICD-10-CM

## 2020-01-09 MED ORDER — ESOMEPRAZOLE MAGNESIUM 40 MG PO CPDR: DELAYED_RELEASE_CAPSULE | ORAL | 3 refills | Status: DC

## 2020-01-09 MED ORDER — VENLAFAXINE HCL ER 75 MG PO CP24: 75.0000 mg | ORAL_CAPSULE | Freq: Every day | ORAL | 3 refills | Status: DC

## 2020-01-09 MED ORDER — QUETIAPINE FUMARATE 50 MG PO TABS: ORAL_TABLET | ORAL | 3 refills | Status: DC

## 2020-01-09 MED ORDER — TRAMADOL HCL 50 MG PO TABS: 50.0000 mg | ORAL_TABLET | Freq: Four times a day (QID) | ORAL | 3 refills | Status: DC | PRN

## 2020-01-09 MED ORDER — QUETIAPINE FUMARATE ER 200 MG PO TB24: 200.0000 mg | ORAL_TABLET | Freq: Every day | ORAL | 3 refills | Status: DC

## 2020-01-09 NOTE — Assessment & Plan Note (Signed)
Stable with cpap, continue.

## 2020-01-09 NOTE — Patient Instructions (Signed)
It was great to meet you! Lets try holding the topiramate.  Continue other medications.  See me in April/May for annual exam.

## 2020-01-09 NOTE — Progress Notes (Signed)
Aimee Garza - 49 y.o. female MRN [id number]  Date of birth: June 13, 1971  Subjective Chief Complaint  Patient presents with  . Medication Refill    meet and greet as well    HPI Aimee Garza is a 49 y.o. female with history of Bipolar D/o, GERD and chronic low back pain here today for follow up visit.    -LBP: Previously followed by Dr. Nelva Bush at [facility] as well.  Has had facet injections as well as ablation.  Chronic pain improved some but still present.  She is prescribed topiramate, gabapentin, effexor and tramadol for management.  She is only taking topiramate on an as needed basis. She is doing well with this and denies side effects related to medication.    -Bipolar d/o:  She is currently taking effexor with seroquel at night.  Current medications are working well.  She denies oversedation.  She denies symptoms of mania/hypomania  ROS:  A comprehensive ROS was completed and negative except as noted per HPI  No Known Allergies  Past Medical History:  Diagnosis Date  . Bipolar disorder ([place]) 03/23/2014  . Chronic low back pain 01/10/2018    History reviewed. No pertinent surgical history.  Social History   Socioeconomic History  . Marital status: Married    Spouse name: Not on file  . Number of children: Not on file  . Years of education: Not on file  . Highest education level: Not on file  Occupational History  . Not on file  Tobacco Use  . Smoking status: Current Every Day Smoker    Packs/day: 1.00    Years: 26.00    Pack years: 26.00    Types: Cigarettes  . Smokeless tobacco: Never Used  Substance and Sexual Activity  . Alcohol use: Yes  . Drug use: No  . Sexual activity: Not Currently    Partners: Male  Other Topics Concern  . Not on file  Social History Narrative  . Not on file   Social Determinants of Health   Financial Resource Strain:   . Difficulty of Paying Living Expenses: Not on file  Food Insecurity:   . Worried About Charity fundraiser  in the Last Year: Not on file  . Ran Out of Food in the Last Year: Not on file  Transportation Needs:   . Lack of Transportation (Medical): Not on file  . Lack of Transportation (Non-Medical): Not on file  Physical Activity:   . Days of Exercise per Week: Not on file  . Minutes of Exercise per Session: Not on file  Stress:   . Feeling of Stress : Not on file  Social Connections:   . Frequency of Communication with Friends and Family: Not on file  . Frequency of Social Gatherings with Friends and Family: Not on file  . Attends Religious Services: Not on file  . Active Member of Clubs or Organizations: Not on file  . Attends Archivist Meetings: Not on file  . Marital Status: Not on file    Family History  Problem Relation Age of Onset  . Alcoholism Unknown        grandfather  . Breast cancer Mother   . Diabetes Unknown        grandmother  . Hypertension Unknown        grandparents    Health Maintenance  Topic Date Due  . PAP SMEAR-Modifier  04/24/2022  . TETANUS/TDAP  03/21/2026  . INFLUENZA VACCINE  Completed  . HIV  Screening  Completed    ----------------------------------------------------------------------------------------------------------------------------------------------------------------------------------------------------------------- Physical Exam BP 135/88   Pulse 99   Ht 5\' 5"  (1.651 m)   Wt 186 lb (84.4 kg)   SpO2 98%   BMI 30.95 kg/m   Physical Exam Constitutional:      Appearance: Normal appearance.  HENT:     Head: Normocephalic and atraumatic.  Eyes:     General: No scleral icterus. Cardiovascular:     Rate and Rhythm: Normal rate.  Pulmonary:     Effort: Pulmonary effort is normal.     Breath sounds: Normal breath sounds.  Skin:    General: Skin is warm and dry.  Neurological:     General: No focal deficit present.     Mental Status: She is alert.  Psychiatric:        Mood and Affect: Mood normal.        Behavior:  Behavior normal.     ------------------------------------------------------------------------------------------------------------------------------------------------------------------------------------------------------------------- Assessment and Plan  Chronic low back pain Chronic facet syndrome.  S/p injection and ablation.  Stable with current medications of tramadol and gabapentin.  Has rx for topiramate, discussed I don't think this is really adding anything additional. Will have her hold for now.   Bipolar disorder Stable with current dose of effexor and seroquel, continue at current dose.   Sleep apnea Stable with cpap, continue.     This visit occurred during the SARS-CoV-2 public health emergency.  Safety protocols were in place, including screening questions prior to the visit, additional usage of staff PPE, and extensive cleaning of exam room while observing appropriate contact time as indicated for disinfecting solutions.

## 2020-01-09 NOTE — Assessment & Plan Note (Signed)
Chronic facet syndrome.  S/p injection and ablation.  Stable with current medications of tramadol and gabapentin.  Has rx for topiramate, discussed I don't think this is really adding anything additional. Will have her hold for now.

## 2020-01-09 NOTE — Assessment & Plan Note (Signed)
Stable with current dose of effexor and seroquel, continue at current dose.

## 2020-02-19 IMAGING — MG DIGITAL SCREENING BILAT W/ TOMO W/ CAD
8 series · 8 of 24 positions shown · non-contrast
Comparison: Previous exam(s).

CLINICAL DATA: Screening.

EXAM:
DIGITAL SCREENING BILATERAL MAMMOGRAM WITH TOMO AND CAD

[L MLO synth-2D]
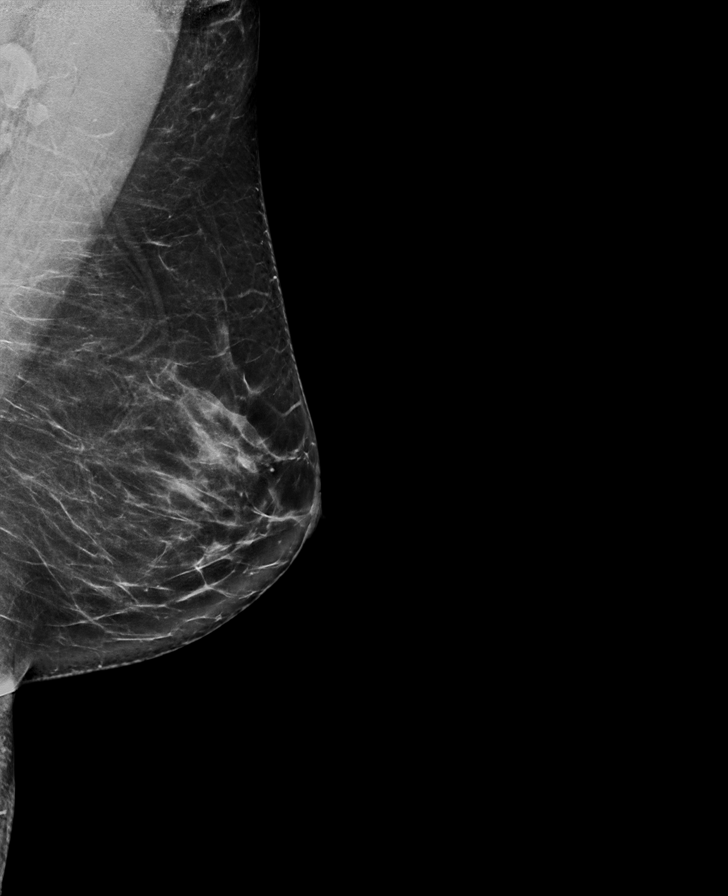

[L CC synth-2D]
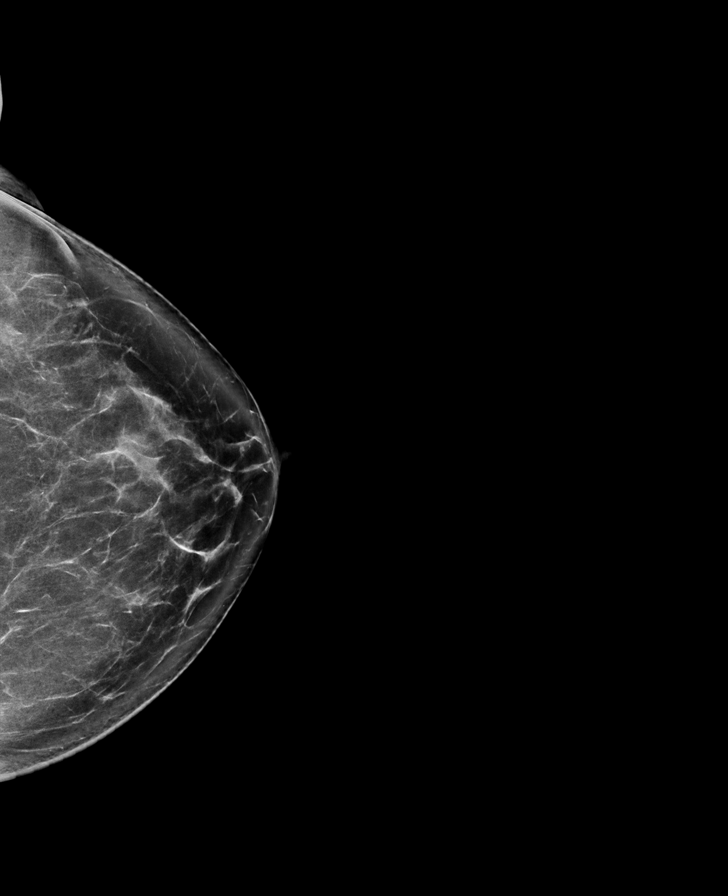

[R CC synth-2D]
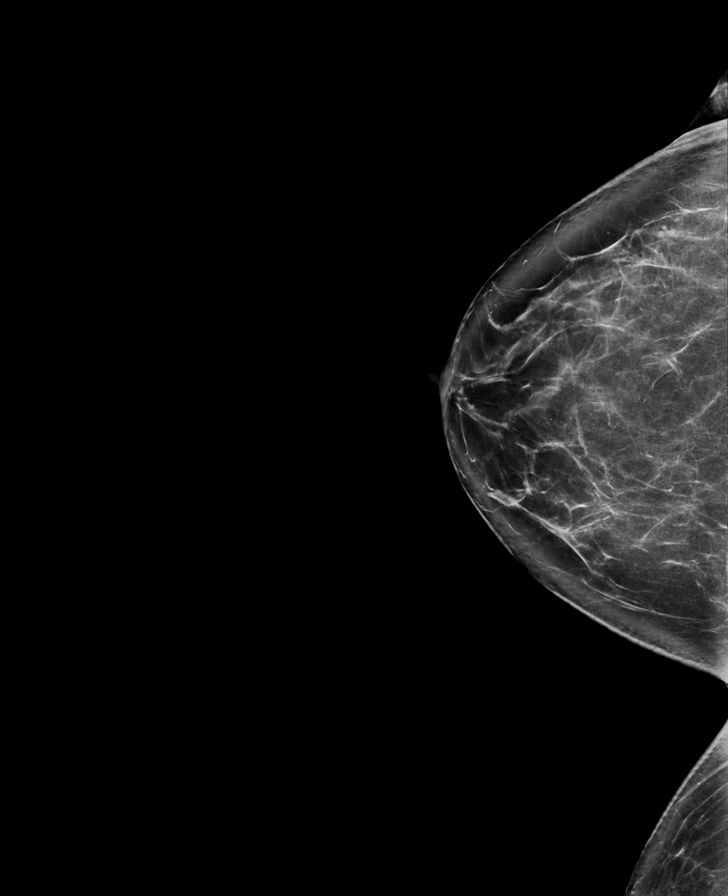

[R MLO synth-2D]
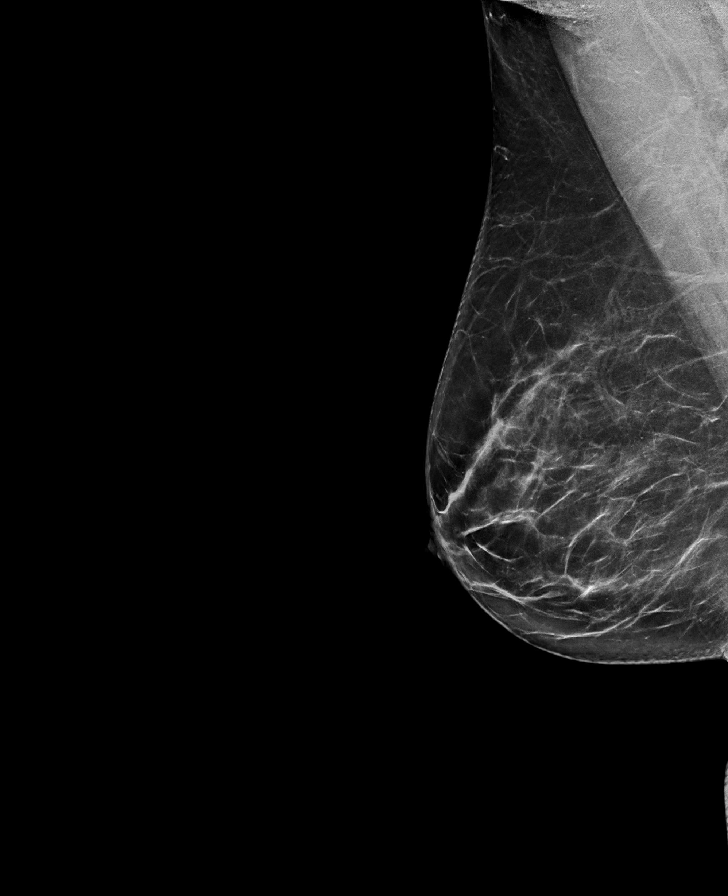

[L MLO tomo · tomo slice 43/84.0]
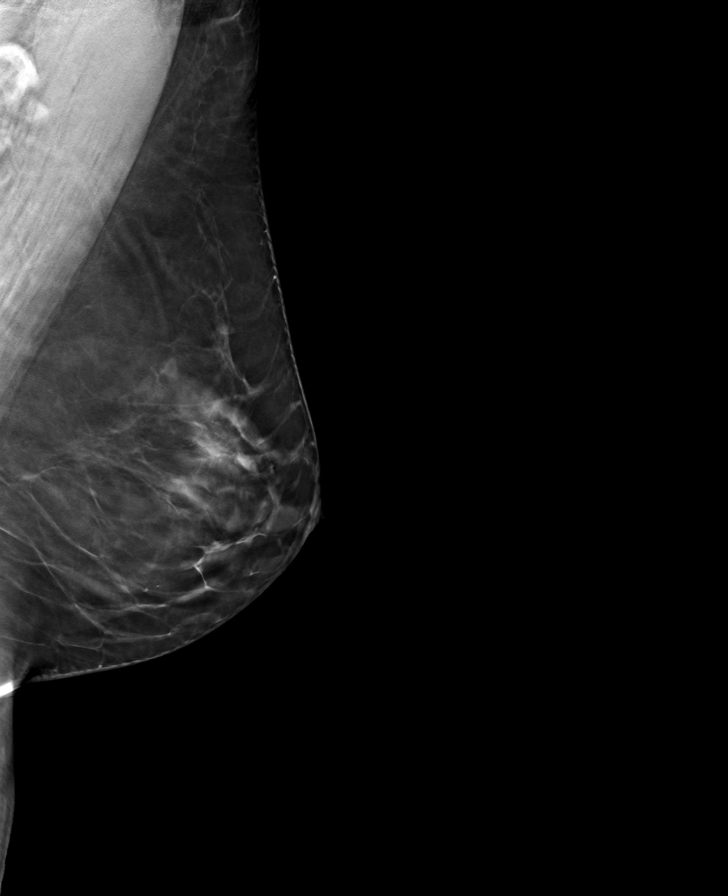

[L CC tomo · tomo slice 43/84.0]
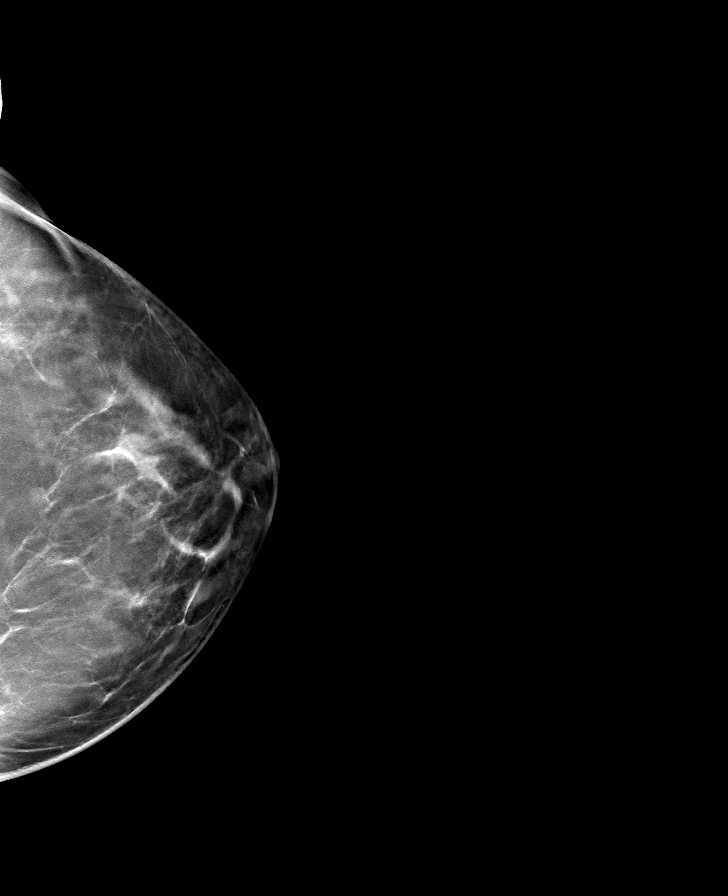

[R CC tomo · tomo slice 42/83.0]
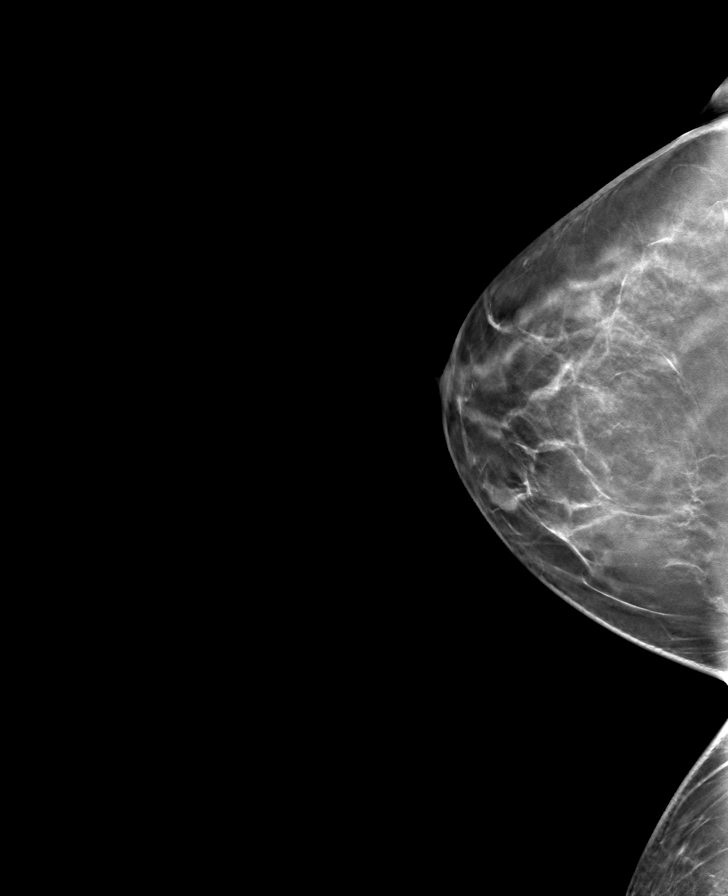

[R MLO tomo · tomo slice 41/82.0]
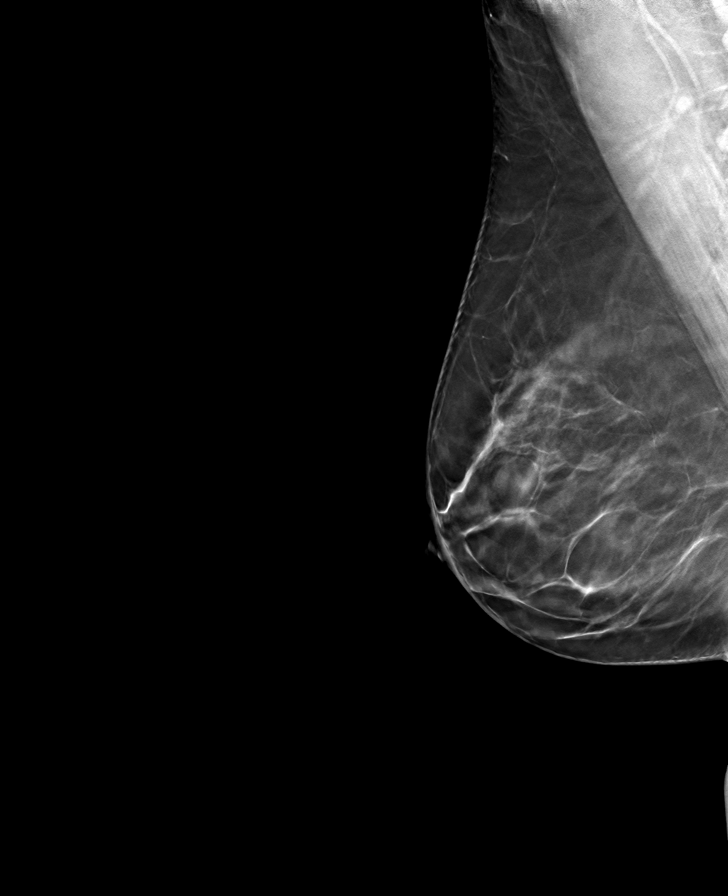

[8 of 24 positions shown; findings below may reference images not displayed]

ACR Breast Density Category b: There are scattered areas of
fibroglandular density.
FINDINGS: There are no findings suspicious for malignancy. Images were
processed with CAD.
IMPRESSION: No mammographic evidence of malignancy. A result letter of this
screening mammogram will be mailed directly to the patient.

RECOMMENDATION:
Screening mammogram in one year. (Code:CN-U-775)

BI-RADS CATEGORY  1: Negative.

## 2020-03-18 ENCOUNTER — Other Ambulatory Visit: Payer: Self-pay

## 2020-03-18 ENCOUNTER — Encounter: Payer: Self-pay | Admitting: Family Medicine

## 2020-03-18 ENCOUNTER — Ambulatory Visit (INDEPENDENT_AMBULATORY_CARE_PROVIDER_SITE_OTHER): Payer: 59 | Admitting: Family Medicine

## 2020-03-18 VITALS — BP 118/78 | HR 62 | Ht 65.0 in | Wt 173.0 lb

## 2020-03-18 DIAGNOSIS — E785 Hyperlipidemia, unspecified: Secondary | ICD-10-CM

## 2020-03-18 DIAGNOSIS — Z0000 Encounter for general adult medical examination without abnormal findings: Secondary | ICD-10-CM

## 2020-03-18 DIAGNOSIS — Z111 Encounter for screening for respiratory tuberculosis: Secondary | ICD-10-CM | POA: Insufficient documentation

## 2020-03-18 MED ORDER — CHANTIX STARTING MONTH PAK 0.5 MG X 11 & 1 MG X 42 PO TABS: ORAL_TABLET | ORAL | 0 refills | Status: DC

## 2020-03-18 NOTE — Progress Notes (Signed)
Aimee Garza - 49 y.o. female MRN [id number]  Date of birth: November 22, 1971  Subjective Chief Complaint  Patient presents with  . Annual Exam    HPI Aimee Garza is a 49 y.o. female with history of HLD, bipolar d/o and nicotine dependence here today for annual exam.  She has no new concerns today.  Chronic conditions are stable.  She has been working on weight loss by following keto diet.  She is also exercising more.  She has restarted smoking but is interested in quitting again.  She requests renewal of Chantix.  She does consume EtOH in small amounts.    Review of Systems  Constitutional: Negative for chills, fever, malaise/fatigue and weight loss.  HENT: Negative for congestion, ear pain and sore throat.   Eyes: Negative for blurred vision, double vision and pain.  Respiratory: Negative for cough and shortness of breath.   Cardiovascular: Negative for chest pain and palpitations.  Gastrointestinal: Negative for abdominal pain, blood in stool, constipation, heartburn and nausea.  Genitourinary: Negative for dysuria and urgency.  Musculoskeletal: Negative for joint pain and myalgias.  Neurological: Negative for dizziness and headaches.  Endo/Heme/Allergies: Does not bruise/bleed easily.  Psychiatric/Behavioral: Negative for depression. The patient is not nervous/anxious and does not have insomnia.      No Known Allergies  Past Medical History:  Diagnosis Date  . Bipolar disorder ([place]) 03/23/2014  . Chronic low back pain 01/10/2018    No past surgical history on file.  Social History   Socioeconomic History  . Marital status: Married    Spouse name: Not on file  . Number of children: Not on file  . Years of education: Not on file  . Highest education level: Not on file  Occupational History  . Not on file  Tobacco Use  . Smoking status: Current Every Day Smoker    Packs/day: 1.00    Years: 26.00    Pack years: 26.00    Types: Cigarettes  . Smokeless tobacco: Never Used   Substance and Sexual Activity  . Alcohol use: Yes  . Drug use: No  . Sexual activity: Not Currently    Partners: Male  Other Topics Concern  . Not on file  Social History Narrative  . Not on file   Social Determinants of Health   Financial Resource Strain:   . Difficulty of Paying Living Expenses:   Food Insecurity:   . Worried About Charity fundraiser in the Last Year:   . Arboriculturist in the Last Year:   Transportation Needs:   . Film/video editor (Medical):   Marland Kitchen Lack of Transportation (Non-Medical):   Physical Activity:   . Days of Exercise per Week:   . Minutes of Exercise per Session:   Stress:   . Feeling of Stress :   Social Connections:   . Frequency of Communication with Friends and Family:   . Frequency of Social Gatherings with Friends and Family:   . Attends Religious Services:   . Active Member of Clubs or Organizations:   . Attends Archivist Meetings:   Marland Kitchen Marital Status:     Family History  Problem Relation Age of Onset  . Alcoholism Unknown        grandfather  . Breast cancer Mother   . Diabetes Unknown        grandmother  . Hypertension Unknown        grandparents    Health Maintenance  Topic Date Due  .  INFLUENZA VACCINE  07/04/2020  . PAP SMEAR-Modifier  04/24/2022  . TETANUS/TDAP  03/21/2026  . HIV Screening  Completed     ----------------------------------------------------------------------------------------------------------------------------------------------------------------------------------------------------------------- Physical Exam BP 118/78   Pulse 62   Ht 5\' 5"  (1.651 m)   Wt 173 lb (78.5 kg)   BMI 28.79 kg/m   Physical Exam Constitutional:      General: She is not in acute distress. HENT:     Head: Normocephalic and atraumatic.     Right Ear: Tympanic membrane normal.     Left Ear: Tympanic membrane normal.     Nose: Nose normal.  Eyes:     General: No scleral icterus.    Conjunctiva/sclera:  Conjunctivae normal.  Neck:     Thyroid: No thyromegaly.  Cardiovascular:     Rate and Rhythm: Normal rate and regular rhythm.     Heart sounds: Normal heart sounds.  Pulmonary:     Effort: Pulmonary effort is normal.     Breath sounds: Normal breath sounds.  Abdominal:     General: Bowel sounds are normal. There is no distension.     Palpations: Abdomen is soft.     Tenderness: There is no abdominal tenderness. There is no guarding.  Musculoskeletal:        General: Normal range of motion.     Cervical back: Normal range of motion and neck supple.  Lymphadenopathy:     Cervical: No cervical adenopathy.  Skin:    General: Skin is warm and dry.     Findings: No rash.  Neurological:     General: No focal deficit present.     Mental Status: She is alert and oriented to person, place, and time.     Cranial Nerves: No cranial nerve deficit.     Coordination: Coordination normal.  Psychiatric:        Mood and Affect: Mood normal.        Behavior: Behavior normal.     ------------------------------------------------------------------------------------------------------------------------------------------------------------------------------------------------------------------- Assessment and Plan  Well adult exam Well adult Orders Placed This Encounter  Procedures  . COMPLETE METABOLIC PANEL WITH GFR  . CBC  . Lipid Profile  Screening: UTD Immunization: UTD, discussed COVID-19 vaccine.  She is undecided about this.  Anticipatory guidance/Risk factor reduction:  Encouraged continued weight loss. Encouraged regular dental care.  Additional recommendations per AVS.   Meds ordered this encounter  Medications  . varenicline (CHANTIX STARTING MONTH PAK) 0.5 MG X 11 & 1 MG X 42 tablet    Sig: Take one 0.5 mg tablet by mouth once daily for 3 days, then increase to one 0.5 mg tablet twice daily for 4 days, then increase to one 1 mg tablet twice daily.    Dispense:  53 tablet     Refill:  0    Return in about 6 months (around 09/17/2020) for bipolar d/o.    This visit occurred during the SARS-CoV-2 public health emergency.  Safety protocols were in place, including screening questions prior to the visit, additional usage of staff PPE, and extensive cleaning of exam room while observing appropriate contact time as indicated for disinfecting solutions.

## 2020-03-18 NOTE — Patient Instructions (Signed)

## 2020-03-18 NOTE — Assessment & Plan Note (Signed)
Well adult Orders Placed This Encounter  Procedures  . COMPLETE METABOLIC PANEL WITH GFR  . CBC  . Lipid Profile  Screening: UTD Immunization: UTD, discussed COVID-19 vaccine.  She is undecided about this.  Anticipatory guidance/Risk factor reduction:  Encouraged continued weight loss. Encouraged regular dental care.  Additional recommendations per AVS.

## 2020-05-11 ENCOUNTER — Other Ambulatory Visit: Payer: Self-pay | Admitting: Family Medicine

## 2020-05-12 NOTE — Telephone Encounter (Signed)
CM-Plz see refill req/thx dmf 

## 2020-05-13 ENCOUNTER — Encounter: Payer: Self-pay | Admitting: Family Medicine

## 2020-05-14 NOTE — Telephone Encounter (Signed)
Does she need an appointment?

## 2020-07-08 ENCOUNTER — Other Ambulatory Visit: Payer: Self-pay | Admitting: Family Medicine

## 2020-09-06 ENCOUNTER — Other Ambulatory Visit: Payer: Self-pay | Admitting: Family Medicine

## 2020-09-13 ENCOUNTER — Other Ambulatory Visit: Payer: Self-pay | Admitting: Family Medicine

## 2020-09-13 DIAGNOSIS — Z1231 Encounter for screening mammogram for malignant neoplasm of breast: Secondary | ICD-10-CM

## 2020-09-16 ENCOUNTER — Ambulatory Visit: Payer: 59 | Admitting: Family Medicine

## 2020-09-30 ENCOUNTER — Other Ambulatory Visit: Payer: Self-pay

## 2020-09-30 ENCOUNTER — Encounter: Payer: Self-pay | Admitting: Family Medicine

## 2020-09-30 ENCOUNTER — Other Ambulatory Visit: Payer: Self-pay | Admitting: Family Medicine

## 2020-09-30 ENCOUNTER — Ambulatory Visit: Payer: 59 | Admitting: Family Medicine

## 2020-09-30 ENCOUNTER — Ambulatory Visit (INDEPENDENT_AMBULATORY_CARE_PROVIDER_SITE_OTHER): Payer: 59

## 2020-09-30 VITALS — BP 122/86 | HR 88 | Temp 97.7°F | Wt 166.1 lb

## 2020-09-30 DIAGNOSIS — F3176 Bipolar disorder, in full remission, most recent episode depressed: Secondary | ICD-10-CM

## 2020-09-30 DIAGNOSIS — Z1231 Encounter for screening mammogram for malignant neoplasm of breast: Secondary | ICD-10-CM

## 2020-09-30 DIAGNOSIS — M545 Low back pain, unspecified: Secondary | ICD-10-CM

## 2020-09-30 DIAGNOSIS — E785 Hyperlipidemia, unspecified: Secondary | ICD-10-CM

## 2020-09-30 DIAGNOSIS — Z23 Encounter for immunization: Secondary | ICD-10-CM

## 2020-09-30 DIAGNOSIS — G8929 Other chronic pain: Secondary | ICD-10-CM

## 2020-09-30 MED ORDER — QUETIAPINE FUMARATE ER 200 MG PO TB24: 200.0000 mg | ORAL_TABLET | Freq: Every day | ORAL | 3 refills | Status: DC

## 2020-09-30 MED ORDER — QUETIAPINE FUMARATE 50 MG PO TABS: ORAL_TABLET | ORAL | 3 refills | Status: DC

## 2020-09-30 MED ORDER — TRAMADOL HCL 50 MG PO TABS: 50.0000 mg | ORAL_TABLET | Freq: Four times a day (QID) | ORAL | 3 refills | Status: DC | PRN

## 2020-09-30 MED ORDER — TOPIRAMATE 50 MG PO TABS: 50.0000 mg | ORAL_TABLET | Freq: Two times a day (BID) | ORAL | 1 refills | Status: DC

## 2020-09-30 MED ORDER — VENLAFAXINE HCL ER 75 MG PO CP24: 75.0000 mg | ORAL_CAPSULE | Freq: Every day | ORAL | 3 refills | Status: DC

## 2020-09-30 NOTE — Patient Instructions (Signed)
Please have labs completed.  Continue current medications.  See me again in about 6 months.

## 2020-09-30 NOTE — Assessment & Plan Note (Signed)
Stable with effexor and serorquel.  Continue medications at current strength.  Follow up again in 6 months.

## 2020-09-30 NOTE — Progress Notes (Signed)
Aimee Garza - 49 y.o. female MRN [id number]  Date of birth: 27-Feb-1971  Subjective Chief Complaint  Patient presents with  . Follow-up    HPI Aimee Garza is a 49 y.o. female here today for follow up of bipolar disorder, chronic low back pain and HLD.   She reports that she is doing well with combination of effexor XR 75mg  and seroquel at bedtime.  She denies side effects from medications.  No mania or severe depression.  She is sleeping well.    Chronic low back pain managed with tramadol and flexeril as needed for several years.  This continues to work well for her and she denies any side effects related to this.  She did have recent flare but this has resolved.    Depression screen [facility] 2/9 09/30/2020 03/18/2020 09/02/2019  Decreased Interest 0 0 0  Down, Depressed, Hopeless 0 0 1  PHQ - 2 Score 0 0 1  Altered sleeping 0 0 1  Tired, decreased energy 0 0 0  Change in appetite 0 0 0  Feeling bad or failure about yourself  0 0 0  Trouble concentrating 1 0 0  Moving slowly or fidgety/restless 0 0 0  Suicidal thoughts 0 0 0  PHQ-9 Score 1 0 2  Difficult doing work/chores Not difficult at all Not difficult at all Not difficult at all   GAD 7 : Generalized Anxiety Score 09/30/2020 03/18/2020 09/02/2019 04/22/2019  Nervous, Anxious, on Edge 0 0 0 0  Control/stop worrying 0 0 0 0  Worry too much - different things 0 0 0 0  Trouble relaxing 0 0 0 1  Restless 0 0 0 1  Easily annoyed or irritable 0 0 1 1  Afraid - awful might happen 0 0 0 0  Total GAD 7 Score 0 0 1 3  Anxiety Difficulty - Not difficult at all - Not difficult at all      ROS:  A comprehensive ROS was completed and negative except as noted per HPI  No Known Allergies  Past Medical History:  Diagnosis Date  . Bipolar disorder ([place]) 03/23/2014  . Chronic low back pain 01/10/2018    History reviewed. No pertinent surgical history.  Social History   Socioeconomic History  . Marital status: Married    Spouse  name: Not on file  . Number of children: Not on file  . Years of education: Not on file  . Highest education level: Not on file  Occupational History  . Not on file  Tobacco Use  . Smoking status: Current Every Day Smoker    Packs/day: 1.00    Years: 26.00    Pack years: 26.00    Types: Cigarettes  . Smokeless tobacco: Never Used  Substance and Sexual Activity  . Alcohol use: Yes  . Drug use: No  . Sexual activity: Not Currently    Partners: Male  Other Topics Concern  . Not on file  Social History Narrative  . Not on file   Social Determinants of Health   Financial Resource Strain:   . Difficulty of Paying Living Expenses: Not on file  Food Insecurity:   . Worried About Charity fundraiser in the Last Year: Not on file  . Ran Out of Food in the Last Year: Not on file  Transportation Needs:   . Lack of Transportation (Medical): Not on file  . Lack of Transportation (Non-Medical): Not on file  Physical Activity:   . Days of Exercise per  Week: Not on file  . Minutes of Exercise per Session: Not on file  Stress:   . Feeling of Stress : Not on file  Social Connections:   . Frequency of Communication with Friends and Family: Not on file  . Frequency of Social Gatherings with Friends and Family: Not on file  . Attends Religious Services: Not on file  . Active Member of Clubs or Organizations: Not on file  . Attends Archivist Meetings: Not on file  . Marital Status: Not on file    Family History  Problem Relation Age of Onset  . Alcoholism Other        grandfather  . Breast cancer Mother   . Diabetes Other        grandmother  . Hypertension Other        grandparents    Health Maintenance  Topic Date Due  . Hepatitis C Screening  Never done  . PAP SMEAR-Modifier  04/24/2022  . TETANUS/TDAP  03/21/2026  . INFLUENZA VACCINE  Completed  . COVID-19 Vaccine  Completed  . HIV Screening  Completed      ----------------------------------------------------------------------------------------------------------------------------------------------------------------------------------------------------------------- Physical Exam BP 122/86 (BP Location: Left Arm, Patient Position: Sitting, Cuff Size: Normal)   Pulse 88   Temp 97.7 F (36.5 C)   Wt 166 lb 1.6 oz (75.3 kg)   SpO2 99%   BMI 27.64 kg/m   Physical Exam Constitutional:      Appearance: Normal appearance.  HENT:     Head: Normocephalic and atraumatic.  Eyes:     General: No scleral icterus. Cardiovascular:     Rate and Rhythm: Normal rate and regular rhythm.  Skin:    General: Skin is warm and dry.  Neurological:     General: No focal deficit present.     Mental Status: She is alert.  Psychiatric:        Mood and Affect: Mood normal.        Behavior: Behavior normal.     ------------------------------------------------------------------------------------------------------------------------------------------------------------------------------------------------------------------- Assessment and Plan  HLD (hyperlipidemia) Due for updated lipid panel, ordered.   Bipolar disorder Stable with effexor and serorquel.  Continue medications at current strength.  Follow up again in 6 months.   Chronic low back pain She is doing well with tramadol.  Also has gabapentin which she uses PRN>   Updated opioid agreement signed.  PDMP reviewed.  Continue current medications.   Meds ordered this encounter  Medications  . QUEtiapine (SEROQUEL XR) 200 MG 24 hr tablet    Sig: Take 1 tablet (200 mg total) by mouth at bedtime.    Dispense:  90 tablet    Refill:  3  . QUEtiapine (SEROQUEL) 50 MG tablet    Sig: TAKE 1 TABLET (50 MG TOTAL) AT BEDTIME BY MOUTH. TAKE IN ADDITION TO EXTENDED RELEASE QUETIAPINE.    Dispense:  90 tablet    Refill:  3  . topiramate (TOPAMAX) 50 MG tablet    Sig: Take 1 tablet (50 mg total) by  mouth 2 (two) times daily.    Dispense:  180 tablet    Refill:  1  . venlafaxine XR (EFFEXOR-XR) 75 MG 24 hr capsule    Sig: Take 1 capsule (75 mg total) by mouth daily with breakfast.    Dispense:  90 capsule    Refill:  3  . traMADol (ULTRAM) 50 MG tablet    Sig: Take 1 tablet (50 mg total) by mouth every 6 (six) hours as needed. for pain  Dispense:  60 tablet    Refill:  3    Not to exceed 2 additional fills before 11/10/2020   Orders Placed This Encounter  Procedures  . Flu Vaccine QUAD 6+ mos PF IM (Fluarix Quad PF)  . COMPLETE METABOLIC PANEL WITH GFR  . CBC  . Lipid Profile    Return in about 6 months (around 03/31/2021) for Bipolar/Low back pain.    This visit occurred during the SARS-CoV-2 public health emergency.  Safety protocols were in place, including screening questions prior to the visit, additional usage of staff PPE, and extensive cleaning of exam room while observing appropriate contact time as indicated for disinfecting solutions.

## 2020-09-30 NOTE — Assessment & Plan Note (Signed)
She is doing well with tramadol.  Also has gabapentin which she uses PRN>   Updated opioid agreement signed.  PDMP reviewed.  Continue current medications.

## 2020-09-30 NOTE — Assessment & Plan Note (Signed)
Due for updated lipid panel, ordered.

## 2020-11-28 ENCOUNTER — Other Ambulatory Visit: Payer: Self-pay | Admitting: Family Medicine

## 2021-03-07 ENCOUNTER — Other Ambulatory Visit: Payer: Self-pay | Admitting: Family Medicine

## 2021-03-31 ENCOUNTER — Ambulatory Visit: Payer: 59 | Admitting: Family Medicine

## 2021-04-18 ENCOUNTER — Other Ambulatory Visit: Payer: Self-pay | Admitting: Family Medicine

## 2021-04-19 NOTE — Telephone Encounter (Signed)
Called and lvm.  tvt

## 2021-04-28 ENCOUNTER — Ambulatory Visit: Payer: 59 | Admitting: Family Medicine

## 2021-05-10 ENCOUNTER — Ambulatory Visit: Payer: 59 | Admitting: Family Medicine

## 2021-05-10 ENCOUNTER — Other Ambulatory Visit: Payer: Self-pay

## 2021-05-10 ENCOUNTER — Encounter: Payer: Self-pay | Admitting: Family Medicine

## 2021-05-10 VITALS — BP 100/68 | HR 97 | Temp 98.3°F | Ht 65.0 in | Wt 171.0 lb

## 2021-05-10 DIAGNOSIS — F3176 Bipolar disorder, in full remission, most recent episode depressed: Secondary | ICD-10-CM

## 2021-05-10 DIAGNOSIS — G8929 Other chronic pain: Secondary | ICD-10-CM

## 2021-05-10 DIAGNOSIS — K5909 Other constipation: Secondary | ICD-10-CM | POA: Diagnosis not present

## 2021-05-10 DIAGNOSIS — M545 Low back pain, unspecified: Secondary | ICD-10-CM | POA: Diagnosis not present

## 2021-05-10 DIAGNOSIS — Z1211 Encounter for screening for malignant neoplasm of colon: Secondary | ICD-10-CM

## 2021-05-10 MED ORDER — GABAPENTIN 100 MG PO CAPS: ORAL_CAPSULE | ORAL | 2 refills | Status: DC

## 2021-05-10 MED ORDER — LINACLOTIDE 290 MCG PO CAPS: 290.0000 ug | ORAL_CAPSULE | Freq: Every day | ORAL | 0 refills | Status: DC

## 2021-05-10 NOTE — Patient Instructions (Signed)
Try the linzess.  Let me know if this is helpful for you and we'll see if we can get your insurance to cover this.  Continue all other medications We'll be in touch with lab results.

## 2021-05-10 NOTE — Assessment & Plan Note (Signed)
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  She needs colon cancer screening. Referral placed.  Given samples of 238mcg linzess to try.

## 2021-05-10 NOTE — Progress Notes (Signed)
Aimee Garza - 50 y.o. female MRN [id number]  Date of birth: 08/18/1971  Subjective Chief Complaint  Patient presents with  . Medication Refill    HPI Aimee Garza is a 50 y.o. female here today for follow up.    She has history of bipolar d/o that his currently managed with topiramate, effexor and seroquel.  This continues to work well for her.  Denies significant depressive symptoms or mania/hypo-mania at this time.   She is taking gabapentin as needed for low back pain.  She also uses tramadol on rare occasion.  This continues to work well for her.  She needs renewal of gabapentin.   She also reports issues with constipation.  She has had this for several years.  She does get some abdominal cramping and bloating at times.  She will sometimes go a whole week without a bowel movement.  She uses laxatives and stool softeners fairly often.  She has never had colon cancer screening.   ROS:  A comprehensive ROS was completed and negative except as noted per HPI    No Known Allergies  Past Medical History:  Diagnosis Date  . Bipolar disorder ([place]) 03/23/2014  . Chronic low back pain 01/10/2018    History reviewed. No pertinent surgical history.  Social History   Socioeconomic History  . Marital status: Married    Spouse name: Not on file  . Number of children: Not on file  . Years of education: Not on file  . Highest education level: Not on file  Occupational History  . Not on file  Tobacco Use  . Smoking status: Current Every Day Smoker    Packs/day: 1.00    Years: 26.00    Pack years: 26.00    Types: Cigarettes  . Smokeless tobacco: Never Used  Substance and Sexual Activity  . Alcohol use: Yes  . Drug use: No  . Sexual activity: Not Currently    Partners: Male  Other Topics Concern  . Not on file  Social History Narrative  . Not on file   Social Determinants of Health   Financial Resource Strain: Not on file  Food Insecurity: Not on file  Transportation  Needs: Not on file  Physical Activity: Not on file  Stress: Not on file  Social Connections: Not on file    Family History  Problem Relation Age of Onset  . Alcoholism Other        grandfather  . Breast cancer Mother   . Diabetes Other        grandmother  . Hypertension Other        grandparents    Health Maintenance  Topic Date Due  . Pneumococcal Vaccine 63-33 Years old (1 of 4 - PCV13) Never done  . Hepatitis C Screening  Never done  . COLONOSCOPY (Pts 45-80yrs Insurance coverage will need to be confirmed)  Never done  . COVID-19 Vaccine (3 - Moderna risk 4-dose series) 07/06/2020  . INFLUENZA VACCINE  07/04/2021  . PAP SMEAR-Modifier  04/24/2022  . Zoster Vaccines- Shingrix (1 of 2) 12/01/2022  . TETANUS/TDAP  03/21/2026  . HIV Screening  Completed  . HPV VACCINES  Aged Out     ----------------------------------------------------------------------------------------------------------------------------------------------------------------------------------------------------------------- Physical Exam BP 100/68 (BP Location: Left Arm, Patient Position: Sitting, Cuff Size: Normal)   Pulse 97   Temp 98.3 F (36.8 C)   Ht 5\' 5"  (1.651 m)   Wt 171 lb (77.6 kg)   SpO2 99%   BMI 28.46 kg/m  Physical Exam Constitutional:      Appearance: Normal appearance.  Eyes:     General: No scleral icterus. Cardiovascular:     Rate and Rhythm: Normal rate and regular rhythm.  Pulmonary:     Effort: Pulmonary effort is normal.     Breath sounds: Normal breath sounds.  Musculoskeletal:     Cervical back: Neck supple.  Neurological:     General: No focal deficit present.     Mental Status: She is alert.  Psychiatric:        Mood and Affect: Mood normal.        Behavior: Behavior normal.      ------------------------------------------------------------------------------------------------------------------------------------------------------------------------------------------------------------------- Assessment and Plan  Chronic low back pain Continues to do well with tramadol and gabapentin as needed.  Gabapentin rx renewed.  PDMP reviewed.  Continue current medications  Bipolar disorder Well controlled with combination of topiramate, seroquel and effexor.  No side effects related to combination of medications.  Continue at current dosing.   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  She needs colon cancer screening. Referral placed.  Given samples of 217mcg linzess to try.    Meds ordered this encounter  Medications  . linaclotide (LINZESS) 290 MCG CAPS capsule    Sig: Take 1 capsule (290 mcg total) by mouth daily before breakfast. EXN:[id number] Exp: 09/2021    Dispense:  8 capsule    Refill:  0  . gabapentin (NEURONTIN) 100 MG capsule    Sig: TAKE ONE CAPSULE BY MOUTH 3 TIMES A DAY AS NEEDED FOR PAIN    Dispense:  90 capsule    Refill:  2    Return in about 6 months (around 11/09/2021) for Med follow up.    This visit occurred during the SARS-CoV-2 public health emergency.  Safety protocols were in place, including screening questions prior to the visit, additional usage of staff PPE, and extensive cleaning of exam room while observing appropriate contact time as indicated for disinfecting solutions.

## 2021-05-10 NOTE — Assessment & Plan Note (Signed)
Continues to do well with tramadol and gabapentin as needed.  Gabapentin rx renewed.  PDMP reviewed.  Continue current medications

## 2021-05-10 NOTE — Assessment & Plan Note (Signed)
Well controlled with combination of topiramate, seroquel and effexor.  No side effects related to combination of medications.  Continue at current dosing.

## 2021-05-11 LAB — COMPLETE METABOLIC PANEL WITH GFR
AG Ratio: 2.2 (calc) (ref 1.0–2.5)
ALT: 13 U/L (ref 6–29)
AST: 17 U/L (ref 10–35)
Albumin: 4.3 g/dL (ref 3.6–5.1)
Alkaline phosphatase (APISO): 86 U/L (ref 31–125)
BUN: 11 mg/dL (ref 7–25)
CO2: 28 mmol/L (ref 20–32)
Calcium: 9.3 mg/dL (ref 8.6–10.2)
Chloride: 105 mmol/L (ref 98–110)
Creat: 0.84 mg/dL (ref 0.50–1.10)
GFR, Est African American: 95 mL/min/{1.73_m2} (ref >=60)
GFR, Est Non African American: 82 mL/min/{1.73_m2} (ref >=60)
Globulin: 2 g/dL (calc) (ref 1.9–3.7)
Glucose, Bld: 86 mg/dL (ref 65–99)
Potassium: 4.7 mmol/L (ref 3.5–5.3)
Sodium: 140 mmol/L (ref 135–146)
Total Bilirubin: 0.4 mg/dL (ref 0.2–1.2)
Total Protein: 6.3 g/dL (ref 6.1–8.1)

## 2021-05-11 LAB — LIPID PANEL
Cholesterol: 224 mg/dL — ABNORMAL HIGH (ref <200)
HDL: 67 mg/dL (ref >=50)
LDL Cholesterol (Calc): 135 mg/dL (calc) — ABNORMAL HIGH
Non-HDL Cholesterol (Calc): 157 mg/dL (calc) — ABNORMAL HIGH (ref <130)
Total CHOL/HDL Ratio: 3.3 (calc) (ref <5.0)
Triglycerides: 112 mg/dL (ref <150)

## 2021-05-11 LAB — CBC
HCT: 39.9 % (ref 35.0–45.0)
Hemoglobin: 13.6 g/dL (ref 11.7–15.5)
MCH: 30.6 pg (ref 27.0–33.0)
MCHC: 34.1 g/dL (ref 32.0–36.0)
MCV: 89.9 fL (ref 80.0–100.0)
MPV: 10.3 fL (ref 7.5–12.5)
Platelets: 243 10*3/uL (ref 140–400)
RBC: 4.44 10*6/uL (ref 3.80–5.10)
RDW: 12.8 % (ref 11.0–15.0)
WBC: 4.7 10*3/uL (ref 3.8–10.8)

## 2021-05-30 ENCOUNTER — Other Ambulatory Visit: Payer: Self-pay | Admitting: Family Medicine

## 2021-07-02 ENCOUNTER — Other Ambulatory Visit: Payer: Self-pay | Admitting: Family Medicine

## 2021-07-22 ENCOUNTER — Other Ambulatory Visit: Payer: Self-pay

## 2021-07-22 ENCOUNTER — Ambulatory Visit (AMBULATORY_SURGERY_CENTER): Payer: 59 | Admitting: *Deleted

## 2021-07-22 VITALS — Ht 65.0 in | Wt 171.0 lb

## 2021-07-22 DIAGNOSIS — Z1211 Encounter for screening for malignant neoplasm of colon: Secondary | ICD-10-CM

## 2021-07-22 MED ORDER — CLENPIQ 10-3.5-12 MG-GM -GM/160ML PO SOLN: 1.0000 | ORAL | 0 refills | Status: DC

## 2021-07-22 NOTE — Progress Notes (Signed)
Patient's pre-visit was done today over the phone with the patient due to COVID-19 pandemic. Name,DOB and address verified. Insurance verified. Patient denies any allergies to Eggs and Soy. Patient denies any problems with anesthesia/sedation. Patient denies taking diet pills or blood thinners. No home Oxygen. Packet of Prep instructions mailed to patient including a copy of a consent form-pt is aware. Patient understands to call us back with any questions or concerns. Patient is aware of our care-partner policy and 0000000 safety protocol.   EMMI education assigned to the patient for the procedure, sent to [facility].   The patient is COVID-19 vaccinated.  Patient has constipation=2day prep given.

## 2021-08-01 ENCOUNTER — Other Ambulatory Visit: Payer: Self-pay | Admitting: Family Medicine

## 2021-08-05 ENCOUNTER — Ambulatory Visit (AMBULATORY_SURGERY_CENTER): Payer: 59 | Admitting: Gastroenterology

## 2021-08-05 ENCOUNTER — Encounter: Payer: Self-pay | Admitting: Gastroenterology

## 2021-08-05 ENCOUNTER — Other Ambulatory Visit: Payer: Self-pay

## 2021-08-05 VITALS — BP 110/79 | HR 77 | Temp 97.9°F | Resp 16 | Ht 65.0 in | Wt 171.0 lb

## 2021-08-05 DIAGNOSIS — K635 Polyp of colon: Secondary | ICD-10-CM

## 2021-08-05 DIAGNOSIS — D127 Benign neoplasm of rectosigmoid junction: Secondary | ICD-10-CM

## 2021-08-05 DIAGNOSIS — Z1211 Encounter for screening for malignant neoplasm of colon: Secondary | ICD-10-CM | POA: Diagnosis present

## 2021-08-05 DIAGNOSIS — K640 First degree hemorrhoids: Secondary | ICD-10-CM

## 2021-08-05 MED ORDER — SODIUM CHLORIDE 0.9 % IV SOLN: 500.0000 mL | Freq: Once | INTRAVENOUS | Status: DC

## 2021-08-05 NOTE — Op Note (Signed)
[facility] Patient Name: Aimee Garza Procedure Date: 08/05/2021 10:08 AM MRN: [id number] Endoscopist: Gerrit Heck , MD Age: 50 Referring MD:  Date of Birth: 1971/11/09 Gender: Female Account #: [id number] Procedure:                Colonoscopy Indications:              Screening for colorectal malignant neoplasm, This                            is the patient's first colonoscopy Medicines:                Monitored Anesthesia Care Procedure:                Pre-Anesthesia Assessment:                           - Prior to the procedure, a History and Physical                            was performed, and patient medications and                            allergies were reviewed. The patient's tolerance of                            previous anesthesia was also reviewed. The risks                            and benefits of the procedure and the sedation                            options and risks were discussed with the patient.                            All questions were answered, and informed consent                            was obtained. Prior Anticoagulants: The patient has                            taken no previous anticoagulant or antiplatelet                            agents. ASA Grade Assessment: II - A patient with                            mild systemic disease. After reviewing the risks                            and benefits, the patient was deemed in                            satisfactory condition to undergo the procedure.  After obtaining informed consent, the colonoscope                            was passed under direct vision. Throughout the                            procedure, the patient's blood pressure, pulse, and                            oxygen saturations were monitored continuously. The                            CF [id number] was introduced through the anus                            and advanced to the  the terminal ileum. The                            colonoscopy was performed without difficulty. The                            patient tolerated the procedure well. The quality                            of the bowel preparation was good. The terminal                            ileum, ileocecal valve, appendiceal orifice, and                            rectum were photographed. Scope In: 10:22:11 AM Scope Out: 10:38:05 AM Scope Withdrawal Time: 0 hours 11 minutes 51 seconds  Total Procedure Duration: 0 hours 15 minutes 54 seconds  Findings:                 The perianal and digital rectal examinations were                            normal.                           A 4 mm polyp was found in the recto-sigmoid colon.                            The polyp was sessile. The polyp was removed with a                            cold snare. Resection and retrieval were complete.                            Estimated blood loss was minimal.                           Non-bleeding internal hemorrhoids were found during  retroflexion. The hemorrhoids were small.                           The exam was otherwise normal throughout the                            remainder of the colon.                           The terminal ileum appeared normal. Complications:            No immediate complications. Estimated Blood Loss:     Estimated blood loss was minimal. Impression:               - One 4 mm polyp at the recto-sigmoid colon,                            removed with a cold snare. Resected and retrieved.                           - Non-bleeding internal hemorrhoids.                           - The examined portion of the ileum was normal. Recommendation:           - Patient has a contact number available for                            emergencies. The signs and symptoms of potential                            delayed complications were discussed with the                             patient. Return to normal activities tomorrow.                            Written discharge instructions were provided to the                            patient.                           - Resume previous diet.                           - Continue present medications.                           - Await pathology results.                           - Repeat colonoscopy for surveillance based on                            pathology results.                           -  Return to GI office PRN.                           - Use fiber, for example Citrucel, Fibercon, Konsyl                            or Metamucil. Gerrit Heck, MD 08/05/2021 10:43:40 AM

## 2021-08-05 NOTE — Progress Notes (Signed)
Pt's states no medical or surgical changes since previsit or office visit. VS assessed by C.W 

## 2021-08-05 NOTE — Patient Instructions (Signed)
Handouts given for hemorrhoids, high fiber diet and polyps.  YOU HAD AN ENDOSCOPIC PROCEDURE TODAY AT [facility]:   Refer to the procedure report that was given to you for any specific questions about what was found during the examination.  If the procedure report does not answer your questions, please call your gastroenterologist to clarify.  If you requested that your care partner not be given the details of your procedure findings, then the procedure report has been included in a sealed envelope for you to review at your convenience later.  YOU SHOULD EXPECT: Some feelings of bloating in the abdomen. Passage of more gas than usual.  Walking can help get rid of the air that was put into your GI tract during the procedure and reduce the bloating. If you had a lower endoscopy (such as a colonoscopy or flexible sigmoidoscopy) you may notice spotting of blood in your stool or on the toilet paper. If you underwent a bowel prep for your procedure, you may not have a normal bowel movement for a few days.  Please Note:  You might notice some irritation and congestion in your nose or some drainage.  This is from the oxygen used during your procedure.  There is no need for concern and it should clear up in a day or so.  SYMPTOMS TO REPORT IMMEDIATELY:  Following lower endoscopy (colonoscopy):  Excessive amounts of blood in the stool  Significant tenderness or worsening of abdominal pains  Swelling of the abdomen that is new, acute  Fever of 100F or higher  For urgent or emergent issues, a gastroenterologist can be reached at any hour by calling [phone]. Do not use MyChart messaging for urgent concerns.    DIET:  We do recommend a small meal at first, but then you may proceed to your regular diet.  Drink plenty of fluids but you should avoid alcoholic beverages for 24 hours.  ACTIVITY:  You should plan to take it easy for the rest of today and you should NOT DRIVE or use heavy  machinery until tomorrow (because of the sedation medicines used during the test).    FOLLOW UP: Our staff will call the number listed on your records 48-72 hours following your procedure to check on you and address any questions or concerns that you may have regarding the information given to you following your procedure. If we do not reach you, we will leave a message.  We will attempt to reach you two times.  During this call, we will ask if you have developed any symptoms of COVID 19. If you develop any symptoms (ie: fever, flu-like symptoms, shortness of breath, cough etc.) before then, please call [phone].  If you test positive for Covid 19 in the 2 weeks post procedure, please call and report this information to [place].    If any biopsies were taken you will be contacted by phone or by letter within the next 1-3 weeks.  Please call us at [phone] if you have not heard about the biopsies in 3 weeks.    SIGNATURES/CONFIDENTIALITY: You and/or your care partner have signed paperwork which will be entered into your electronic medical record.  These signatures attest to the fact that that the information above on your After Visit Summary has been reviewed and is understood.  Full responsibility of the confidentiality of this discharge information lies with you and/or your care-partner.

## 2021-08-05 NOTE — Progress Notes (Signed)
GASTROENTEROLOGY PROCEDURE H&P NOTE   Primary Care Physician: Luetta Nutting, DO    Reason for Procedure:   Colon cancer screening  Plan:    Colonoscopy  Patient is appropriate for endoscopic procedure(s) in the ambulatory ([facility]) setting.  The nature of the procedure, as well as the risks, benefits, and alternatives were carefully and thoroughly reviewed with the patient. Ample time for discussion and questions allowed. The patient understood, was satisfied, and agreed to proceed.     HPI: Aimee Garza is a 50 y.o. female who presents for colonoscopy for routine colon cancer screening. Does have a hx of chronic constipation for years, treated with OTC laxatives and stool softeners prn. Otherwise, no active GI sxs and no known Fhx of CRC.   Past Medical History:  Diagnosis Date   Bipolar disorder ([place]) 03/23/2014   Chronic low back pain 01/10/2018   Sleep apnea    mild-no CPAP    Past Surgical History:  Procedure Laterality Date   back injection     back nerve burnt     TUBAL LIGATION     WISDOM TOOTH EXTRACTION      Prior to Admission medications   Medication Sig Start Date End Date Taking? Authorizing Provider  Cholecalciferol (VITAMIN D3 ADULT GUMMIES PO) Take by mouth.   Yes [provider]  cyclobenzaprine (FLEXERIL) 10 MG tablet TAKE 0.5-1 TABLETS BY MOUTH 3 TIMES DAILY AS NEEDED FOR MUSCLE SPASMS. 08/02/21  Yes Luetta Nutting, DO  gabapentin (NEURONTIN) 100 MG capsule TAKE ONE CAPSULE BY MOUTH 3 TIMES A DAY AS NEEDED FOR PAIN 05/10/21  Yes Luetta Nutting, DO  MELATONIN GUMMIES PO Take by mouth.   Yes [provider]  Multiple Vitamins-Minerals (MULTIVITAMIN GUMMIES ADULT PO) Take by mouth.   Yes [provider]  QUEtiapine (SEROQUEL XR) 200 MG 24 hr tablet Take 1 tablet (200 mg total) by mouth at bedtime. 09/30/20  Yes Luetta Nutting, DO  QUEtiapine (SEROQUEL) 50 MG tablet TAKE 1 TABLET (50 MG TOTAL) AT BEDTIME BY MOUTH. TAKE IN ADDITION  TO EXTENDED RELEASE QUETIAPINE. 09/30/20  Yes Luetta Nutting, DO  traMADol (ULTRAM) 50 MG tablet TAKE 1 TABLET BY MOUTH EVERY 6 HOURS AS NEEDED FOR PAIN 08/02/21  Yes Luetta Nutting, DO  venlafaxine XR (EFFEXOR-XR) 75 MG 24 hr capsule Take 1 capsule (75 mg total) by mouth daily with breakfast. 09/30/20  Yes Luetta Nutting, DO  albuterol (VENTOLIN HFA) 108 (90 Base) MCG/ACT inhaler TAKE 2 PUFFS BY MOUTH EVERY 6 HOURS AS NEEDED FOR WHEEZE OR SHORTNESS OF BREATH Patient not taking: Reported on 08/05/2021 03/28/19   Gregor Hams, MD  Misc. Devices MISC Auto Cpap 5-15 water pressure.  New mask and supplies. DME: [facility] Patient not taking: Reported on 08/05/2021 04/23/19   [provider]  Probiotic Product (PROBIOTIC GUMMIES PO) Take by mouth. Patient not taking: Reported on 08/05/2021    [provider]  topiramate (TOPAMAX) 50 MG tablet TAKE 1 TABLET BY MOUTH TWICE A DAY Patient not taking: No sig reported 03/09/21   Luetta Nutting, DO    Current Outpatient Medications  Medication Sig Dispense Refill   Cholecalciferol (VITAMIN D3 ADULT GUMMIES PO) Take by mouth.     cyclobenzaprine (FLEXERIL) 10 MG tablet TAKE 0.5-1 TABLETS BY MOUTH 3 TIMES DAILY AS NEEDED FOR MUSCLE SPASMS. 30 tablet 0   gabapentin (NEURONTIN) 100 MG capsule TAKE ONE CAPSULE BY MOUTH 3 TIMES A DAY AS NEEDED FOR PAIN 90 capsule 2   MELATONIN GUMMIES  PO Take by mouth.     Multiple Vitamins-Minerals (MULTIVITAMIN GUMMIES ADULT PO) Take by mouth.     QUEtiapine (SEROQUEL XR) 200 MG 24 hr tablet Take 1 tablet (200 mg total) by mouth at bedtime. 90 tablet 3   QUEtiapine (SEROQUEL) 50 MG tablet TAKE 1 TABLET (50 MG TOTAL) AT BEDTIME BY MOUTH. TAKE IN ADDITION TO EXTENDED RELEASE QUETIAPINE. 90 tablet 3   traMADol (ULTRAM) 50 MG tablet TAKE 1 TABLET BY MOUTH EVERY 6 HOURS AS NEEDED FOR PAIN 60 tablet 1   venlafaxine XR (EFFEXOR-XR) 75 MG 24 hr capsule Take 1 capsule (75 mg total) by mouth daily with breakfast. 90 capsule 3    albuterol (VENTOLIN HFA) 108 (90 Base) MCG/ACT inhaler TAKE 2 PUFFS BY MOUTH EVERY 6 HOURS AS NEEDED FOR WHEEZE OR SHORTNESS OF BREATH (Patient not taking: Reported on 08/05/2021) 18 Inhaler 0   Misc. Devices MISC Auto Cpap 5-15 water pressure.  New mask and supplies. DME: [facility] (Patient not taking: Reported on 08/05/2021)     Probiotic Product (PROBIOTIC GUMMIES PO) Take by mouth. (Patient not taking: Reported on 08/05/2021)     topiramate (TOPAMAX) 50 MG tablet TAKE 1 TABLET BY MOUTH TWICE A DAY (Patient not taking: No sig reported) 180 tablet 1   Current Facility-Administered Medications  Medication Dose Route Frequency Provider Last Rate Last Admin   0.9 %  sodium chloride infusion  500 mL Intravenous Once Garnette Greb V, DO        Allergies as of 08/05/2021   (No Known Allergies)    Family History  Problem Relation Age of Onset   Breast cancer Mother    Alcoholism Other        grandfather   Diabetes Other        grandmother   Hypertension Other        grandparents   Colon cancer Neg Hx    Colon polyps Neg Hx    Esophageal cancer Neg Hx    Rectal cancer Neg Hx    Stomach cancer Neg Hx     Social History   Socioeconomic History   Marital status: Married    Spouse name: Not on file   Number of children: Not on file   Years of education: Not on file   Highest education level: Not on file  Occupational History   Not on file  Tobacco Use   Smoking status: Every Day    Packs/day: 1.00    Years: 26.00    Pack years: 26.00    Types: Cigarettes   Smokeless tobacco: Never  Vaping Use   Vaping Use: Never used  Substance and Sexual Activity   Alcohol use: Yes    Alcohol/week: 2.0 standard drinks    Types: 2 Standard drinks or equivalent per week   Drug use: No   Sexual activity: Not Currently    Partners: Male  Other Topics Concern   Not on file  Social History Narrative   Not on file   Social Determinants of Health   Financial Resource Strain: Not on file   Food Insecurity: Not on file  Transportation Needs: Not on file  Physical Activity: Not on file  Stress: Not on file  Social Connections: Not on file  Intimate Partner Violence: Not on file    Physical Exam: Vital signs in last 24 hours: '@BP'$  (!) 128/92   Pulse 88   Temp 97.9 F (36.6 C) (Skin)   Ht '5\' 5"'$  (1.651 m)  Wt 171 lb (77.6 kg)   SpO2 99%   BMI 28.46 kg/m  GEN: NAD EYE: Sclerae anicteric ENT: MMM CV: Non-tachycardic Pulm: CTA b/l GI: Soft, NT/ND NEURO:  Alert & Oriented x 3   Gerrit Heck, DO [facility]   08/05/2021 10:14 AM

## 2021-08-05 NOTE — Progress Notes (Signed)
Called to room to assist during endoscopic procedure.  Patient ID and intended procedure confirmed with present staff. Received instructions for my participation in the procedure from the performing physician.  

## 2021-08-05 NOTE — Progress Notes (Signed)
Report given to PACU, vss 

## 2021-08-09 ENCOUNTER — Telehealth: Payer: Self-pay

## 2021-08-09 NOTE — Telephone Encounter (Signed)
  Follow up Call-  Call back number 08/05/2021  Post procedure Call Back phone  # [phone]  Permission to leave phone message Yes  Some recent data might be hidden     Patient questions:  Do you have a fever, pain , or abdominal swelling? No. Pain Score  0 *  Have you tolerated food without any problems? Yes.    Have you been able to return to your normal activities? Yes.    Do you have any questions about your discharge instructions: Diet   No. Medications  No. Follow up visit  No.  Do you have questions or concerns about your Care? No.  Actions: * If pain score is 4 or above: No action needed, pain <4.

## 2021-08-12 ENCOUNTER — Encounter: Payer: Self-pay | Admitting: Gastroenterology

## 2021-08-31 ENCOUNTER — Other Ambulatory Visit: Payer: Self-pay | Admitting: Family Medicine

## 2021-08-31 DIAGNOSIS — F3176 Bipolar disorder, in full remission, most recent episode depressed: Secondary | ICD-10-CM

## 2021-10-18 ENCOUNTER — Other Ambulatory Visit: Payer: Self-pay | Admitting: Family Medicine

## 2021-11-08 ENCOUNTER — Ambulatory Visit: Payer: 59 | Admitting: Family Medicine

## 2021-12-13 ENCOUNTER — Ambulatory Visit: Payer: 59 | Admitting: Family Medicine

## 2021-12-13 ENCOUNTER — Encounter: Payer: Self-pay | Admitting: Family Medicine

## 2021-12-13 ENCOUNTER — Other Ambulatory Visit: Payer: Self-pay | Admitting: Family Medicine

## 2021-12-13 ENCOUNTER — Other Ambulatory Visit: Payer: Self-pay

## 2021-12-13 VITALS — BP 110/61 | HR 91 | Temp 97.2°F | Ht 65.0 in | Wt 183.0 lb

## 2021-12-13 DIAGNOSIS — F3176 Bipolar disorder, in full remission, most recent episode depressed: Secondary | ICD-10-CM | POA: Diagnosis not present

## 2021-12-13 DIAGNOSIS — Z23 Encounter for immunization: Secondary | ICD-10-CM

## 2021-12-13 DIAGNOSIS — Z1231 Encounter for screening mammogram for malignant neoplasm of breast: Secondary | ICD-10-CM

## 2021-12-13 DIAGNOSIS — M5136 Other intervertebral disc degeneration, lumbar region: Secondary | ICD-10-CM | POA: Diagnosis not present

## 2021-12-13 DIAGNOSIS — G894 Chronic pain syndrome: Secondary | ICD-10-CM | POA: Diagnosis not present

## 2021-12-13 MED ORDER — TOPIRAMATE 50 MG PO TABS: 50.0000 mg | ORAL_TABLET | Freq: Two times a day (BID) | ORAL | 1 refills | Status: DC

## 2021-12-13 MED ORDER — TRAMADOL HCL 50 MG PO TABS: 50.0000 mg | ORAL_TABLET | Freq: Four times a day (QID) | ORAL | 1 refills | Status: DC | PRN

## 2021-12-13 MED ORDER — GABAPENTIN 100 MG PO CAPS: ORAL_CAPSULE | ORAL | 2 refills | Status: DC

## 2021-12-13 MED ORDER — CYCLOBENZAPRINE HCL 10 MG PO TABS: ORAL_TABLET | ORAL | 1 refills | Status: DC

## 2021-12-13 NOTE — Assessment & Plan Note (Signed)
Stable with current medications.  We will continue current strength.

## 2021-12-13 NOTE — Progress Notes (Signed)
Aimee Garza - 51 y.o. female MRN [id number]  Date of birth: 1971-08-06  Subjective Chief Complaint  Patient presents with   Follow-up    HPI Aimee Garza is a 51 year old female here today for follow-up visit.  She reports that she continues to do well with current medications.  She is not experiencing any side effects at this time.  Bipolar disorder remains well controlled with combination of Effexor, topiramate and Seroquel.  Chronic low back pain remains well managed with tramadol and gabapentin.  No side effects to current medications.  She is due for updated pain contract.  ROS:  A comprehensive ROS was completed and negative except as noted per HPI  No Known Allergies  Past Medical History:  Diagnosis Date   Bipolar disorder ([place]) 03/23/2014   Chronic low back pain 01/10/2018   Sleep apnea    mild-no CPAP    Past Surgical History:  Procedure Laterality Date   back injection     back nerve burnt     TUBAL LIGATION     WISDOM TOOTH EXTRACTION      Social History   Socioeconomic History   Marital status: Married    Spouse name: Not on file   Number of children: Not on file   Years of education: Not on file   Highest education level: Not on file  Occupational History   Not on file  Tobacco Use   Smoking status: Every Day    Packs/day: 1.00    Years: 26.00    Pack years: 26.00    Types: Cigarettes   Smokeless tobacco: Never  Vaping Use   Vaping Use: Never used  Substance and Sexual Activity   Alcohol use: Yes    Alcohol/week: 2.0 standard drinks    Types: 2 Standard drinks or equivalent per week   Drug use: No   Sexual activity: Not Currently    Partners: Male  Other Topics Concern   Not on file  Social History Narrative   Not on file   Social Determinants of Health   Financial Resource Strain: Not on file  Food Insecurity: Not on file  Transportation Needs: Not on file  Physical Activity: Not on file  Stress: Not on file  Social Connections:  Not on file    Family History  Problem Relation Age of Onset   Breast cancer Mother    Alcoholism Other        grandfather   Diabetes Other        grandmother   Hypertension Other        grandparents   Colon cancer Neg Hx    Colon polyps Neg Hx    Esophageal cancer Neg Hx    Rectal cancer Neg Hx    Stomach cancer Neg Hx     Health Maintenance  Topic Date Due   Pneumococcal Vaccine 48-86 Years old (1 - PCV) Never done   Hepatitis C Screening  Never done   COVID-19 Vaccine (3 - Moderna risk series) 07/06/2020   PAP SMEAR-Modifier  04/24/2022   TETANUS/TDAP  03/21/2026   COLONOSCOPY (Pts 45-24yrs Insurance coverage will need to be confirmed)  08/06/2031   INFLUENZA VACCINE  Completed   HIV Screening  Completed   HPV VACCINES  Aged Out     ----------------------------------------------------------------------------------------------------------------------------------------------------------------------------------------------------------------- Physical Exam BP 110/61 (BP Location: Left Arm, Patient Position: Sitting, Cuff Size: Normal)    Pulse 91    Temp (!) 97.2 F (36.2 C)    Ht 5\' 5"  (1.651  m)    Wt 183 lb (83 kg)    SpO2 98%    BMI 30.45 kg/m   Physical Exam Constitutional:      Appearance: Normal appearance.  Eyes:     General: No scleral icterus. Cardiovascular:     Rate and Rhythm: Normal rate and regular rhythm.  Pulmonary:     Effort: Pulmonary effort is normal.     Breath sounds: Normal breath sounds.  Musculoskeletal:     Cervical back: Neck supple.  Neurological:     Mental Status: She is alert.  Psychiatric:        Mood and Affect: Mood normal.        Behavior: Behavior normal.    ------------------------------------------------------------------------------------------------------------------------------------------------------------------------------------------------------------------- Assessment and Plan  Degeneration of lumbar  intervertebral disc She continues to do well with combination of gabapentin and tramadol.  We will continue her current strength.  PDMP reviewed and updated pain contract signed today.  Bipolar disorder Stable with current medications.  We will continue current strength.   Meds ordered this encounter  Medications   cyclobenzaprine (FLEXERIL) 10 MG tablet    Sig: TAKE 0.5 TO 1 TABLET BY MOUTH 3 TIMES A DAY AS NEEDED FOR MUSCLE SPASM    Dispense:  30 tablet    Refill:  1   gabapentin (NEURONTIN) 100 MG capsule    Sig: TAKE ONE CAPSULE BY MOUTH 3 TIMES A DAY AS NEEDED FOR PAIN    Dispense:  270 capsule    Refill:  2   topiramate (TOPAMAX) 50 MG tablet    Sig: Take 1 tablet (50 mg total) by mouth 2 (two) times daily.    Dispense:  180 tablet    Refill:  1   traMADol (ULTRAM) 50 MG tablet    Sig: Take 1 tablet (50 mg total) by mouth every 6 (six) hours as needed. for pain    Dispense:  60 tablet    Refill:  1    Not to exceed 4 additional fills before 01/29/2022    Return in about 6 months (around 06/12/2022) for Mood/Back pain.    This visit occurred during the SARS-CoV-2 public health emergency.  Safety protocols were in place, including screening questions prior to the visit, additional usage of staff PPE, and extensive cleaning of exam room while observing appropriate contact time as indicated for disinfecting solutions.

## 2021-12-13 NOTE — Assessment & Plan Note (Signed)
She continues to do well with combination of gabapentin and tramadol.  We will continue her current strength.  PDMP reviewed and updated pain contract signed today.

## 2021-12-14 ENCOUNTER — Ambulatory Visit (INDEPENDENT_AMBULATORY_CARE_PROVIDER_SITE_OTHER): Payer: 59

## 2021-12-14 DIAGNOSIS — Z1231 Encounter for screening mammogram for malignant neoplasm of breast: Secondary | ICD-10-CM

## 2021-12-21 ENCOUNTER — Other Ambulatory Visit: Payer: Self-pay | Admitting: Family Medicine

## 2021-12-21 DIAGNOSIS — Z0184 Encounter for antibody response examination: Secondary | ICD-10-CM

## 2021-12-26 ENCOUNTER — Ambulatory Visit: Payer: 59

## 2022-01-02 ENCOUNTER — Other Ambulatory Visit: Payer: Self-pay

## 2022-01-02 ENCOUNTER — Ambulatory Visit (INDEPENDENT_AMBULATORY_CARE_PROVIDER_SITE_OTHER): Payer: 59 | Admitting: Family Medicine

## 2022-01-02 VITALS — BP 128/89 | HR 99

## 2022-01-02 DIAGNOSIS — Z111 Encounter for screening for respiratory tuberculosis: Secondary | ICD-10-CM

## 2022-01-02 NOTE — Progress Notes (Signed)
Established Patient Office Visit  Subjective:  Patient ID: Aimee Garza, female    DOB: [birth year]  Age: 51 y.o. MRN: [id number]  CC:  Chief Complaint  Patient presents with   PPD Placement    HPI Aimee Garza presents for PPD placement.   Past Medical History:  Diagnosis Date   Bipolar disorder ([place]) 03/23/2014   Chronic low back pain 01/10/2018   Sleep apnea    mild-no CPAP    Past Surgical History:  Procedure Laterality Date   back injection     back nerve burnt     TUBAL LIGATION     WISDOM TOOTH EXTRACTION      Family History  Problem Relation Age of Onset   Breast cancer Mother    Alcoholism Other        grandfather   Diabetes Other        grandmother   Hypertension Other        grandparents   Colon cancer Neg Hx    Colon polyps Neg Hx    Esophageal cancer Neg Hx    Rectal cancer Neg Hx    Stomach cancer Neg Hx     Social History   Socioeconomic History   Marital status: Married    Spouse name: Not on file   Number of children: Not on file   Years of education: Not on file   Highest education level: Not on file  Occupational History   Not on file  Tobacco Use   Smoking status: Every Day    Packs/day: 1.00    Years: 26.00    Pack years: 26.00    Types: Cigarettes   Smokeless tobacco: Never  Vaping Use   Vaping Use: Never used  Substance and Sexual Activity   Alcohol use: Yes    Alcohol/week: 2.0 standard drinks    Types: 2 Standard drinks or equivalent per week   Drug use: No   Sexual activity: Not Currently    Partners: Male  Other Topics Concern   Not on file  Social History Narrative   Not on file   Social Determinants of Health   Financial Resource Strain: Not on file  Food Insecurity: Not on file  Transportation Needs: Not on file  Physical Activity: Not on file  Stress: Not on file  Social Connections: Not on file  Intimate Partner Violence: Not on file    Outpatient Medications Prior to Visit  Medication Sig  Dispense Refill   albuterol (VENTOLIN HFA) 108 (90 Base) MCG/ACT inhaler TAKE 2 PUFFS BY MOUTH EVERY 6 HOURS AS NEEDED FOR WHEEZE OR SHORTNESS OF BREATH 18 Inhaler 0   Cholecalciferol (VITAMIN D3 ADULT GUMMIES PO) Take by mouth.     cyclobenzaprine (FLEXERIL) 10 MG tablet TAKE 0.5 TO 1 TABLET BY MOUTH 3 TIMES A DAY AS NEEDED FOR MUSCLE SPASM 30 tablet 1   gabapentin (NEURONTIN) 100 MG capsule TAKE ONE CAPSULE BY MOUTH 3 TIMES A DAY AS NEEDED FOR PAIN 270 capsule 2   MELATONIN GUMMIES PO Take by mouth.     Misc. Devices MISC      Multiple Vitamins-Minerals (MULTIVITAMIN GUMMIES ADULT PO) Take by mouth.     Probiotic Product (PROBIOTIC GUMMIES PO) Take by mouth.     QUEtiapine (SEROQUEL XR) 200 MG 24 hr tablet TAKE 1 TABLET BY MOUTH AT BEDTIME. 90 tablet 3   QUEtiapine (SEROQUEL) 50 MG tablet TAKE 1 TABLET (50 MG TOTAL) AT BEDTIME BY MOUTH. TAKE IN ADDITION TO  EXTENDED RELEASE QUETIAPINE. 90 tablet 3   topiramate (TOPAMAX) 50 MG tablet Take 1 tablet (50 mg total) by mouth 2 (two) times daily. 180 tablet 1   traMADol (ULTRAM) 50 MG tablet Take 1 tablet (50 mg total) by mouth every 6 (six) hours as needed. for pain 60 tablet 1   venlafaxine XR (EFFEXOR-XR) 75 MG 24 hr capsule TAKE 1 CAPSULE BY MOUTH DAILY WITH BREAKFAST. 90 capsule 3   No facility-administered medications prior to visit.    No Known Allergies  ROS Review of Systems    Objective:    Physical Exam  BP (!) 120/91 (BP Location: Left Arm, Patient Position: Sitting, Cuff Size: Normal)    Pulse (!) 102    SpO2 98%  Wt Readings from Last 3 Encounters:  12/13/21 183 lb (83 kg)  08/05/21 171 lb (77.6 kg)  07/22/21 171 lb (77.6 kg)     Health Maintenance Due  Topic Date Due   Hepatitis C Screening  Never done   COVID-19 Vaccine (3 - Moderna risk series) 07/06/2020    There are no preventive care reminders to display for this patient.  Lab Results  Component Value Date   TSH 1.90 05/14/2018   Lab Results  Component  Value Date   WBC 4.7 05/10/2021   HGB 13.6 05/10/2021   HCT 39.9 05/10/2021   MCV 89.9 05/10/2021   PLT 243 05/10/2021   Lab Results  Component Value Date   NA 140 05/10/2021   K 4.7 05/10/2021   CO2 28 05/10/2021   GLUCOSE 86 05/10/2021   BUN 11 05/10/2021   CREATININE 0.84 05/10/2021   BILITOT 0.4 05/10/2021   ALKPHOS 102 08/17/2019   AST 17 05/10/2021   ALT 13 05/10/2021   PROT 6.3 05/10/2021   ALBUMIN 4.3 04/13/2017   CALCIUM 9.3 05/10/2021   Lab Results  Component Value Date   CHOL 224 (H) 05/10/2021   Lab Results  Component Value Date   HDL 67 05/10/2021   Lab Results  Component Value Date   LDLCALC 135 (H) 05/10/2021   Lab Results  Component Value Date   TRIG 112 05/10/2021   Lab Results  Component Value Date   CHOLHDL 3.3 05/10/2021   No results found for: HGBA1C    Assessment & Plan:  PPD placement - Placed on left forearm. Patient tolerated injection well without complications. Patient advised to schedule next injection 2-3 days from today.    Problem List Items Addressed This Visit   None Visit Diagnoses     Screening-pulmonary TB    -  Primary   Relevant Orders   PPD (Completed)       No orders of the defined types were placed in this encounter.   Follow-up: Return in about 2 days (around 01/04/2022) for PPD read. Aimee Garza, Aimee Garza, [place]

## 2022-01-02 NOTE — Progress Notes (Signed)
Medical screening examination/treatment was performed by qualified clinical staff member and as supervising physician I was immediately available for consultation/collaboration. I have reviewed documentation and agree with assessment and plan.  Aryannah Mohon, DO  

## 2022-01-03 LAB — VARICELLA ZOSTER ANTIBODY, IGG: Varicella IgG: 2510 index

## 2022-01-04 ENCOUNTER — Other Ambulatory Visit: Payer: Self-pay

## 2022-01-04 ENCOUNTER — Ambulatory Visit (INDEPENDENT_AMBULATORY_CARE_PROVIDER_SITE_OTHER): Payer: 59 | Admitting: Family Medicine

## 2022-01-04 ENCOUNTER — Encounter: Payer: Self-pay | Admitting: Family Medicine

## 2022-01-04 VITALS — BP 126/94 | HR 92

## 2022-01-04 DIAGNOSIS — Z111 Encounter for screening for respiratory tuberculosis: Secondary | ICD-10-CM

## 2022-01-04 LAB — TB SKIN TEST
Induration: 0 mm
TB Skin Test: NEGATIVE

## 2022-01-04 NOTE — Progress Notes (Signed)
Medical screening examination/treatment was performed by qualified clinical staff member and as supervising physician I was immediately available for consultation/collaboration. I have reviewed documentation and agree with assessment and plan.  Beulah Matusek, DO  

## 2022-01-04 NOTE — Progress Notes (Signed)
° °  Subjective:    Patient ID: Aimee Garza, female    DOB: 05-09-1971, 51 y.o.   MRN: [id number]  HPI Patient is here for PPD read   Review of Systems     Objective:   Physical Exam        Assessment & Plan:  Results were negative.

## 2022-03-01 ENCOUNTER — Other Ambulatory Visit: Payer: Self-pay | Admitting: Family Medicine

## 2022-05-02 ENCOUNTER — Other Ambulatory Visit: Payer: Self-pay | Admitting: Family Medicine

## 2022-06-19 ENCOUNTER — Encounter: Payer: Self-pay | Admitting: Family Medicine

## 2022-06-19 ENCOUNTER — Ambulatory Visit: Payer: 59 | Admitting: Family Medicine

## 2022-06-19 VITALS — BP 115/79 | HR 97 | Ht 65.0 in | Wt 180.0 lb

## 2022-06-19 DIAGNOSIS — G8929 Other chronic pain: Secondary | ICD-10-CM

## 2022-06-19 DIAGNOSIS — F3176 Bipolar disorder, in full remission, most recent episode depressed: Secondary | ICD-10-CM | POA: Diagnosis not present

## 2022-06-19 DIAGNOSIS — F17200 Nicotine dependence, unspecified, uncomplicated: Secondary | ICD-10-CM | POA: Diagnosis not present

## 2022-06-19 DIAGNOSIS — K1370 Unspecified lesions of oral mucosa: Secondary | ICD-10-CM

## 2022-06-19 DIAGNOSIS — M545 Low back pain, unspecified: Secondary | ICD-10-CM | POA: Diagnosis not present

## 2022-06-19 MED ORDER — BUSPIRONE HCL 10 MG PO TABS: 10.0000 mg | ORAL_TABLET | Freq: Three times a day (TID) | ORAL | 1 refills | Status: DC

## 2022-06-19 NOTE — Assessment & Plan Note (Signed)
Continue gabapentin with tramadol and flexeril as needed .

## 2022-06-19 NOTE — Progress Notes (Signed)
Aimee Garza - 51 y.o. female MRN [id number]  Date of birth: Jul 31, 1971  Subjective Chief Complaint  Patient presents with   Mouth Lesions    HPI Aimee Garza is a 50 y.o. female here today for follow up visit.   Overall medications for management of Bipolar d/o continue to work well for her.  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  No other notable side effects.  She doesn't want to change medications because these are working so well for her.   She has gabapentin which she uses daily and tramadol as needed for chronic low back pain.  Uses on occasion.  No side effects or overuse form this.  Additionally has flexeril that she uses at times.   Concerned about lesion along the roof of her mouth.  Area is sore.  Present for about 1 month.  Denies any burns or trauma to the roof of the mouth.  She is a daily smoker.   ROS:  A comprehensive ROS was completed and negative except as noted per HPI  No Known Allergies  Past Medical History:  Diagnosis Date   Bipolar disorder ([place]) 03/23/2014   Chronic low back pain 01/10/2018   Sleep apnea    mild-no CPAP    Past Surgical History:  Procedure Laterality Date   back injection     back nerve burnt     TUBAL LIGATION     WISDOM TOOTH EXTRACTION      Social History   Socioeconomic History   Marital status: Married    Spouse name: Not on file   Number of children: Not on file   Years of education: Not on file   Highest education level: Not on file  Occupational History   Not on file  Tobacco Use   Smoking status: Every Day    Packs/day: 1.00    Years: 26.00    Total pack years: 26.00    Types: Cigarettes   Smokeless tobacco: Never  Vaping Use   Vaping Use: Never used  Substance and Sexual Activity   Alcohol use: Yes    Alcohol/week: 2.0 standard drinks of alcohol    Types: 2 Standard drinks or equivalent per week   Drug use: No   Sexual activity: Not Currently    Partners: Male  Other Topics  Concern   Not on file  Social History Narrative   Not on file   Social Determinants of Health   Financial Resource Strain: Not on file  Food Insecurity: Not on file  Transportation Needs: Not on file  Physical Activity: Inactive (06/04/2018)   Exercise Vital Sign    Days of Exercise per Week: 0 days    Minutes of Exercise per Session: 0 min  Stress: Not on file  Social Connections: Not on file    Family History  Problem Relation Age of Onset   Breast cancer Mother    Alcoholism Other        grandfather   Diabetes Other        grandmother   Hypertension Other        grandparents   Colon cancer Neg Hx    Colon polyps Neg Hx    Esophageal cancer Neg Hx    Rectal cancer Neg Hx    Stomach cancer Neg Hx     Health Maintenance  Topic Date Due   PAP SMEAR-Modifier  04/24/2022   COVID-19 Vaccine (3 - Moderna risk series) 09/19/2022 (Originally 07/06/2020)   Hepatitis  C Screening  06/20/2023 (Originally 12/01/1990)   INFLUENZA VACCINE  07/04/2022   TETANUS/TDAP  03/21/2026   COLONOSCOPY (Pts 45-52yr Insurance coverage will need to be confirmed)  08/06/2031   HIV Screening  Completed   HPV VACCINES  Aged Out     ----------------------------------------------------------------------------------------------------------------------------------------------------------------------------------------------------------------- Physical Exam BP 115/79 (BP Location: Left Arm, Patient Position: Sitting, Cuff Size: Normal)   Pulse 97   Ht '5\' 5"'$  (1.651 m)   Wt 180 lb (81.6 kg)   SpO2 97%   BMI 29.95 kg/m   Physical Exam Constitutional:      Appearance: Normal appearance.  HENT:     Mouth/Throat:     Comments: Small raised lesion along soft palate.  Eyes:     General: No scleral icterus. Cardiovascular:     Rate and Rhythm: Normal rate and regular rhythm.  Pulmonary:     Effort: Pulmonary effort is normal.     Breath sounds: Normal breath sounds.  Musculoskeletal:      Cervical back: Neck supple.  Neurological:     Mental Status: She is alert.  Psychiatric:        Mood and Affect: Mood normal.        Behavior: Behavior normal.     ------------------------------------------------------------------------------------------------------------------------------------------------------------------------------------------------------------------- Assessment and Plan  Smoker Counseling provided for smoking cessation.   Chronic low back pain Continue gabapentin with tramadol and flexeril as needed .  Bipolar disorder Stable symptoms with current medications.  She is having some sexual side effects.  Will see if addition of buspar is helpful in offsetting this some.  She has tried bupropion in the past and did not tolerate well.    Meds ordered this encounter  Medications   busPIRone (BUSPAR) 10 MG tablet    Sig: Take 1 tablet (10 mg total) by mouth 3 (three) times daily.    Dispense:  90 tablet    Refill:  1    Return in about 6 months (around 12/20/2022) for GAD.    This visit occurred during the SARS-CoV-2 public health emergency.  Safety protocols were in place, including screening questions prior to the visit, additional usage of staff PPE, and extensive cleaning of exam room while observing appropriate contact time as indicated for disinfecting solutions.

## 2022-06-19 NOTE — Patient Instructions (Addendum)
Try adding buspar 2-3 times daily.   Referral placed to Dr. Jetta Lout, you will be contacted to set up visit.

## 2022-06-19 NOTE — Assessment & Plan Note (Signed)
Counseling provided for smoking cessation.

## 2022-06-19 NOTE — Assessment & Plan Note (Signed)
Stable symptoms with current medications.  She is having some sexual side effects.  Will see if addition of buspar is helpful in offsetting this some.  She has tried bupropion in the past and did not tolerate well.

## 2022-07-11 ENCOUNTER — Other Ambulatory Visit: Payer: Self-pay | Admitting: Family Medicine

## 2022-07-12 ENCOUNTER — Encounter: Payer: 59 | Admitting: Family Medicine

## 2022-08-03 ENCOUNTER — Other Ambulatory Visit: Payer: Self-pay | Admitting: Family Medicine

## 2022-08-31 ENCOUNTER — Other Ambulatory Visit: Payer: Self-pay | Admitting: Family Medicine

## 2022-09-07 ENCOUNTER — Encounter: Payer: 59 | Admitting: Family Medicine

## 2022-09-22 ENCOUNTER — Other Ambulatory Visit: Payer: Self-pay | Admitting: Family Medicine

## 2022-10-19 ENCOUNTER — Other Ambulatory Visit: Payer: Self-pay | Admitting: Family Medicine

## 2022-10-28 ENCOUNTER — Other Ambulatory Visit: Payer: Self-pay | Admitting: Family Medicine

## 2022-10-28 DIAGNOSIS — F3176 Bipolar disorder, in full remission, most recent episode depressed: Secondary | ICD-10-CM

## 2022-10-31 NOTE — Telephone Encounter (Signed)
Please be sure that she schedule follow up.   Thanks!

## 2022-11-01 NOTE — Telephone Encounter (Signed)
Called pt lvm to call back to schedule a follow up appointment with Dr. Zigmund Daniel. tvt

## 2022-11-16 ENCOUNTER — Ambulatory Visit: Payer: 59 | Admitting: Family Medicine

## 2022-11-16 ENCOUNTER — Encounter: Payer: Self-pay | Admitting: Family Medicine

## 2022-11-16 VITALS — BP 130/88 | HR 104 | Ht 65.0 in | Wt 172.0 lb

## 2022-11-16 DIAGNOSIS — G8929 Other chronic pain: Secondary | ICD-10-CM

## 2022-11-16 DIAGNOSIS — F3176 Bipolar disorder, in full remission, most recent episode depressed: Secondary | ICD-10-CM | POA: Diagnosis not present

## 2022-11-16 DIAGNOSIS — M545 Low back pain, unspecified: Secondary | ICD-10-CM

## 2022-11-16 DIAGNOSIS — E663 Overweight: Secondary | ICD-10-CM

## 2022-11-16 DIAGNOSIS — M654 Radial styloid tenosynovitis [de Quervain]: Secondary | ICD-10-CM | POA: Diagnosis not present

## 2022-11-16 MED ORDER — SEMAGLUTIDE-WEIGHT MANAGEMENT 0.5 MG/0.5ML ~~LOC~~ SOAJ: 0.5000 mg | SUBCUTANEOUS | 0 refills | Status: AC

## 2022-11-16 MED ORDER — SEMAGLUTIDE-WEIGHT MANAGEMENT 0.25 MG/0.5ML ~~LOC~~ SOAJ: 0.2500 mg | SUBCUTANEOUS | 0 refills | Status: AC

## 2022-11-16 MED ORDER — TOPIRAMATE 50 MG PO TABS: 50.0000 mg | ORAL_TABLET | Freq: Two times a day (BID) | ORAL | 1 refills | Status: DC

## 2022-11-16 MED ORDER — SEMAGLUTIDE-WEIGHT MANAGEMENT 1 MG/0.5ML ~~LOC~~ SOAJ: 1.0000 mg | SUBCUTANEOUS | 0 refills | Status: AC

## 2022-11-16 NOTE — Assessment & Plan Note (Signed)
She may continue gabapentin 3 times daily with tramadol as needed.

## 2022-11-16 NOTE — Progress Notes (Signed)
Aimee Garza - 51 y.o. female MRN [id number]  Date of birth: [birth year]  Subjective Chief Complaint  Patient presents with   Follow-up    HPI Aimee Garza is a 51 y.o. female here today for follow up visit.   She is having pain in the left thumb and wrist.  She has had this for a few weeks.  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  This does make her pain worse.  She does have some swelling over the area.  She has tried lidocaine ointment without much improvement.  She reports she is doing well with current medications.  History of bipolar disorder that is well-managed with combination of Effexor, Seroquel and topiramate.  She has not had any significant side effects or medications.  She is interested in adding medication to help with weight loss.  Interested in trying [place].  She does have history of hyperlipidemia in addition to being overweight.  She has tried working on diet without significant improvement.  History of chronic low back pain.  Managed with combination of gabapentin 3 times daily as well as tramadol as needed.  This continues to work well for her without side effects.  ROS:  A comprehensive ROS was completed and negative except as noted per HPI  No Known Allergies  Past Medical History:  Diagnosis Date   Bipolar disorder ([place]) 03/23/2014   Chronic low back pain 01/10/2018   Sleep apnea    mild-no CPAP    Past Surgical History:  Procedure Laterality Date   back injection     back nerve burnt     TUBAL LIGATION     WISDOM TOOTH EXTRACTION      Social History   Socioeconomic History   Marital status: Married    Spouse name: Not on file   Number of children: Not on file   Years of education: Not on file   Highest education level: Not on file  Occupational History   Not on file  Tobacco Use   Smoking status: Every Day    Packs/day: 1.00    Years: 26.00    Total pack years: 26.00    Types: Cigarettes    Smokeless tobacco: Never  Vaping Use   Vaping Use: Never used  Substance and Sexual Activity   Alcohol use: Yes    Alcohol/week: 2.0 standard drinks of alcohol    Types: 2 Standard drinks or equivalent per week   Drug use: No   Sexual activity: Not Currently    Partners: Male  Other Topics Concern   Not on file  Social History Narrative   Not on file   Social Determinants of Health   Financial Resource Strain: Not on file  Food Insecurity: Not on file  Transportation Needs: Not on file  Physical Activity: Inactive (06/04/2018)   Exercise Vital Sign    Days of Exercise per Week: 0 days    Minutes of Exercise per Session: 0 min  Stress: Not on file  Social Connections: Not on file    Family History  Problem Relation Age of Onset   Breast cancer Mother    Alcoholism Other        grandfather   Diabetes Other        grandmother   Hypertension Other        grandparents   Colon cancer Neg Hx    Colon polyps Neg Hx    Esophageal cancer Neg Hx    Rectal  cancer Neg Hx    Stomach cancer Neg Hx     Health Maintenance  Topic Date Due   PAP SMEAR-Modifier  01/04/2023 (Originally 04/24/2022)   COVID-19 Vaccine (3 - Moderna risk series) 01/04/2023 (Originally 07/06/2020)   Hepatitis C Screening  06/20/2023 (Originally 12/01/1990)   DTaP/Tdap/Td (3 - Td or Tdap) 03/21/2026   COLONOSCOPY (Pts 45-30yr Insurance coverage will need to be confirmed)  08/06/2031   INFLUENZA VACCINE  Completed   HIV Screening  Completed   HPV VACCINES  Aged Out     ----------------------------------------------------------------------------------------------------------------------------------------------------------------------------------------------------------------- Physical Exam BP 130/88 (BP Location: Left Arm, Patient Position: Sitting, Cuff Size: Normal)   Pulse (!) 104   Ht '5\' 5"'$  (1.651 m)   Wt 172 lb (78 kg)   SpO2 98%   BMI 28.62 kg/m   Physical Exam Constitutional:       Appearance: Normal appearance.  HENT:     Head: Normocephalic and atraumatic.  Eyes:     General: No scleral icterus. Cardiovascular:     Rate and Rhythm: Normal rate and regular rhythm.  Pulmonary:     Effort: Pulmonary effort is normal.     Breath sounds: Normal breath sounds.  Musculoskeletal:     Cervical back: Neck supple.     Comments: She has some mild swelling along the distal radial portion of her wrist.  Positive Finkelstein test.  Neurological:     General: No focal deficit present.     Mental Status: She is alert.  Psychiatric:        Mood and Affect: Mood normal.        Behavior: Behavior normal.     ------------------------------------------------------------------------------------------------------------------------------------------------------------------------------------------------------------------- Assessment and Plan  De Quervain's tenosynovitis, left She reports that she does have a thumb spica brace at home and I encouraged her to use this.  Recommend application of Voltaren gel to the area.  She is interested in having an injection and I will have her schedule with Dr. TDianah Fieldfor evaluation of this.  Chronic low back pain She may continue gabapentin 3 times daily with tramadol as needed.  Bipolar disorder Stable with current medications.  Recommend continuation.  She will return in a few months for annual exam and will get updated labs.  Overweight She is overweight with history of hyperlipidemia.  Interested in adding [place]  Prescription sent in.  Side effects and red flags of medication reviewed.   Meds ordered this encounter  Medications   topiramate (TOPAMAX) 50 MG tablet    Sig: Take 1 tablet (50 mg total) by mouth 2 (two) times daily.    Dispense:  180 tablet    Refill:  1   Semaglutide-Weight Management 0.25 MG/0.5ML SOAJ    Sig: Inject 0.25 mg into the skin once a week for 28 days.    Dispense:  2 mL    Refill:  0    Semaglutide-Weight Management 0.5 MG/0.5ML SOAJ    Sig: Inject 0.5 mg into the skin once a week for 28 days.    Dispense:  2 mL    Refill:  0   Semaglutide-Weight Management 1 MG/0.5ML SOAJ    Sig: Inject 1 mg into the skin once a week for 28 days.    Dispense:  2 mL    Refill:  0    Return in about 4 months (around 03/18/2023) for Annual exam/fasting labs/pap.    This visit occurred during the SARS-CoV-2 public health emergency.  Safety protocols were in place, including screening questions  prior to the visit, additional usage of staff PPE, and extensive cleaning of exam room while observing appropriate contact time as indicated for disinfecting solutions.

## 2022-11-16 NOTE — Assessment & Plan Note (Addendum)
She is overweight with history of hyperlipidemia.  Interested in adding [place].  Prescription sent in.  Side effects and red flags of medication reviewed.

## 2022-11-16 NOTE — Assessment & Plan Note (Addendum)
She reports that she does have a thumb spica brace at home and I encouraged her to use this.  Recommend application of Voltaren gel to the area.  She is interested in having an injection and I will have her schedule with Dr. Dianah Field for evaluation of this.

## 2022-11-16 NOTE — Patient Instructions (Signed)
De Quervain's Tenosynovitis  De Quervain's tenosynovitis is a condition that causes inflammation of the tendon on the thumb side of the wrist. Tendons are cords of tissue that connect bones to muscles. The tendons in the hand pass through a tunnel called a sheath. A slippery layer of tissue (synovium) lets the tendons move smoothly in the sheath. With de Quervain's tenosynovitis, the sheath swells or thickens, causing friction and pain. The condition is also called de Quervain's disease and de Quervain's syndrome. It occurs most often in women who are 58-35 years old. What are the causes? The exact cause of this condition is not known. It may be associated with overuse of the hand and wrist. What increases the risk? You are more likely to develop this condition if you: Use your hands far more than normal, especially if you repeat certain movements that involve twisting your hand or using a tight grip. Are pregnant. Are a middle-aged woman. Have rheumatoid arthritis. Have diabetes. What are the signs or symptoms? The main symptom of this condition is pain on the thumb side of the wrist. The pain may get worse when you grasp something or turn your wrist. Other symptoms may include: Pain that extends up the forearm. Swelling of your wrist and hand. Trouble moving the thumb and wrist. A sensation of snapping in the wrist. A bump filled with fluid (cyst) in the area of the pain. How is this diagnosed? This condition may be diagnosed based on: Your symptoms and medical history. A physical exam. During the exam, your health care provider may do a simple test W[place] test) that involves pulling your thumb and wrist to see if this causes pain. You may also need to have an X-ray or ultrasound. How is this treated? Treatment for this condition may include: Avoiding any activity that causes pain and swelling. Taking medicines. Anti-inflammatory medicines and corticosteroid injections may be used  to reduce inflammation and relieve pain. Wearing a splint. Having surgery. This may be needed if other treatments do not work. Once the pain and swelling have gone down, you may start: Physical therapy. This includes exercises to improve movement and strength in your wrist and thumb. Occupational therapy. This includes adjusting how you move your wrist. Follow these instructions at home: If you have a splint: Wear the splint as told by your health care provider. Remove it only as told by your health care provider. Loosen the splint if your fingers tingle, become numb, or turn cold and blue. Keep the splint clean. If the splint is not waterproof: Do not let it get wet. Cover it with a watertight covering when you take a bath or a shower. Managing pain, stiffness, and swelling  Avoid movements and activities that cause pain and swelling in the wrist area. If directed, put ice on the painful area. This may be helpful after doing activities that involve the sore wrist. To do this: Put ice in a plastic bag. Place a towel between your skin and the bag. Leave the ice on for 20 minutes, 2-3 times a day. Remove the ice if your skin turns bright red. This is very important. If you cannot feel pain, heat, or cold, you have a greater risk of damage to the area. Move your fingers often to reduce stiffness and swelling. Raise (elevate) the injured area above the level of your heart while you are sitting or lying down. General instructions Return to your normal activities as told by your health care provider. Ask your  health care provider what activities are safe for you. Take over-the-counter and prescription medicines only as told by your health care provider. Keep all follow-up visits. This is important. Contact a health care provider if: Your pain medicine does not help. Your pain gets worse. You develop new symptoms. Summary De Quervain's tenosynovitis is a condition that causes inflammation  of the tendon on the thumb side of the wrist. The condition occurs most often in women who are 80-30 years old. The exact cause of this condition is not known. It may be associated with overuse of the hand and wrist. Treatment starts with avoiding activity that causes pain or swelling in the wrist area. Other treatments may include wearing a splint and taking medicine. Sometimes, surgery is needed. This information is not intended to replace advice given to you by your health care provider. Make sure you discuss any questions you have with your health care provider. Document Revised: 03/03/2020 Document Reviewed: 03/03/2020 Elsevier Patient Education  [place].

## 2022-11-16 NOTE — Assessment & Plan Note (Signed)
Stable with current medications.  Recommend continuation.  She will return in a few months for annual exam and will get updated labs.

## 2022-11-22 ENCOUNTER — Other Ambulatory Visit: Payer: Self-pay | Admitting: Family Medicine

## 2022-11-29 ENCOUNTER — Ambulatory Visit (INDEPENDENT_AMBULATORY_CARE_PROVIDER_SITE_OTHER): Payer: 59

## 2022-11-29 ENCOUNTER — Ambulatory Visit: Payer: 59 | Admitting: Sports Medicine

## 2022-11-29 DIAGNOSIS — M654 Radial styloid tenosynovitis [de Quervain]: Secondary | ICD-10-CM

## 2022-11-29 MED ORDER — TRIAMCINOLONE ACETONIDE 40 MG/ML IJ SUSP
40.0000 mg | Freq: Once | INTRAMUSCULAR | Status: AC
  Administered 2022-11-29: 40 mg via INTRAMUSCULAR

## 2022-11-29 NOTE — Progress Notes (Signed)
    Procedures performed today:    Procedure: Real-time Ultrasound Guided injection of the left first extensor compartment Device: Samsung HS60  Verbal informed consent obtained.  Time-out conducted.  Noted no overlying erythema, induration, or other signs of local infection.  Skin prepped in a sterile fashion.  Local anesthesia: Topical Ethyl chloride.  With sterile technique and under real time ultrasound guidance: Noted first extensor compartment tenosynovitis, 1 cc lidocaine, 1 cc bupivacaine, 1 cc kenalog 40 injected easily. Completed without difficulty  Advised to call if fevers/chills, erythema, induration, drainage, or persistent bleeding.  Images permanently stored and available for review in PACS.  Impression: Technically successful ultrasound guided injection.  Independent interpretation of notes and tests performed by another provider:   None.  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 On exam she has swelling over the radial styloid process as well as a positive Finkelstein sign all consistent with de Quervain's tendinitis. She does desire aggressive treatment today so we performed a first extensor compartment injection with ultrasound guidance, adding home physical therapy, she will continue bracing, return to see me in 6 weeks.    ____________________________________________ Gwen Her. Dianah Field, M.D., ABFM., CAQSM., AME. Primary Care and Sports Medicine  [facility]  Adjunct Professor of [place]  Risk manager

## 2022-11-29 NOTE — Addendum Note (Signed)
Addended by: Tarri Glenn A on: 11/29/2022 04:55 PM   Modules accepted: Orders

## 2022-11-29 NOTE — Assessment & Plan Note (Signed)
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 On exam she has swelling over the radial styloid process as well as a positive Finkelstein sign all consistent with de Quervain's tendinitis. She does desire aggressive treatment today so we performed a first extensor compartment injection with ultrasound guidance, adding home physical therapy, she will continue bracing, return to see me in 6 weeks.

## 2022-12-01 ENCOUNTER — Telehealth: Payer: Self-pay

## 2022-12-01 NOTE — Telephone Encounter (Addendum)
Initiated Prior authorization [id number] 0.5MG /0.5ML auto-injectors Via: Covermymeds Case/Key:[id number] Status: as of 212/28/23 Reason:, this request is approved from 12/01/2022 to 06/29/2023. Notified Pt via: Mychart

## 2022-12-15 ENCOUNTER — Other Ambulatory Visit: Payer: Self-pay | Admitting: Family Medicine

## 2023-01-06 ENCOUNTER — Other Ambulatory Visit: Payer: Self-pay | Admitting: Family Medicine

## 2023-01-10 ENCOUNTER — Other Ambulatory Visit: Payer: Self-pay | Admitting: Family Medicine

## 2023-01-10 ENCOUNTER — Ambulatory Visit: Payer: 59 | Admitting: Sports Medicine

## 2023-02-17 ENCOUNTER — Other Ambulatory Visit: Payer: Self-pay | Admitting: Family Medicine

## 2023-02-19 ENCOUNTER — Other Ambulatory Visit: Payer: Self-pay | Admitting: Family Medicine

## 2023-02-19 ENCOUNTER — Ambulatory Visit: Payer: 59 | Admitting: Family Medicine

## 2023-02-19 ENCOUNTER — Ambulatory Visit (INDEPENDENT_AMBULATORY_CARE_PROVIDER_SITE_OTHER): Payer: 59 | Admitting: Family Medicine

## 2023-02-19 VITALS — BP 130/80 | HR 89 | Ht 65.0 in

## 2023-02-19 DIAGNOSIS — Z111 Encounter for screening for respiratory tuberculosis: Secondary | ICD-10-CM

## 2023-02-19 DIAGNOSIS — Z1231 Encounter for screening mammogram for malignant neoplasm of breast: Secondary | ICD-10-CM

## 2023-02-19 NOTE — Progress Notes (Signed)
Aimee Garza - 52 y.o. female MRN [id number]  Date of birth: [birth year]  Subjective Chief Complaint  Patient presents with   PPD Placement    Nurse Visit - PPD placement. Traveling nurse - need for employer.     HPI  No Known Allergies  Past Medical History:  Diagnosis Date   Bipolar disorder ([place]) 03/23/2014   Chronic low back pain 01/10/2018   Sleep apnea    mild-no CPAP    Past Surgical History:  Procedure Laterality Date   back injection     back nerve burnt     TUBAL LIGATION     WISDOM TOOTH EXTRACTION      Social History   Socioeconomic History   Marital status: Married    Spouse name: Not on file   Number of children: Not on file   Years of education: Not on file   Highest education level: Not on file  Occupational History   Not on file  Tobacco Use   Smoking status: Every Day    Packs/day: 1.00    Years: 26.00    Additional pack years: 0.00    Total pack years: 26.00    Types: Cigarettes   Smokeless tobacco: Never  Vaping Use   Vaping Use: Never used  Substance and Sexual Activity   Alcohol use: Yes    Alcohol/week: 2.0 standard drinks of alcohol    Types: 2 Standard drinks or equivalent per week   Drug use: No   Sexual activity: Not Currently    Partners: Male  Other Topics Concern   Not on file  Social History Narrative   Not on file   Social Determinants of Health   Financial Resource Strain: Not on file  Food Insecurity: Not on file  Transportation Needs: Not on file  Physical Activity: Inactive (06/04/2018)   Exercise Vital Sign    Days of Exercise per Week: 0 days    Minutes of Exercise per Session: 0 min  Stress: Not on file  Social Connections: Not on file    Family History  Problem Relation Age of Onset   Breast cancer Mother    Alcoholism Other        grandfather   Diabetes Other        grandmother   Hypertension Other        grandparents   Colon cancer Neg Hx    Colon polyps Neg Hx    Esophageal cancer Neg Hx     Rectal cancer Neg Hx    Stomach cancer Neg Hx     Health Maintenance  Topic Date Due   Zoster Vaccines- Shingrix (1 of 2) Never done   COVID-19 Vaccine (3 - Moderna risk series) 07/06/2020   PAP SMEAR-Modifier  04/24/2022   Lung Cancer Screening  Never done   Hepatitis C Screening  06/20/2023 (Originally 12/01/1990)   MAMMOGRAM  12/15/2023   DTaP/Tdap/Td (3 - Td or Tdap) 03/21/2026   COLONOSCOPY (Pts 45-54yrs Insurance coverage will need to be confirmed)  08/06/2031   INFLUENZA VACCINE  Completed   HIV Screening  Completed   HPV VACCINES  Aged Out     ----------------------------------------------------------------------------------------------------------------------------------------------------------------------------------------------------------------- Physical Exam BP 130/80   Pulse 89   Ht 5\' 5"  (1.651 m)   SpO2 100%   BMI 28.62 kg/m   Physical Exam  ------------------------------------------------------------------------------------------------------------------------------------------------------------------------------------------------------------------- Assessment and Plan  No problem-specific Assessment & Plan notes found for this encounter.   No orders of the defined types were placed in this encounter.  Return for 48-72 hours for  PPD read as nurse visit. .    This visit occurred during the SARS-CoV-2 public health emergency.  Safety protocols were in place, including screening questions prior to the visit, additional usage of staff PPE, and extensive cleaning of exam room while observing appropriate contact time as indicated for disinfecting solutions.

## 2023-02-19 NOTE — Progress Notes (Signed)
Medical screening examination/treatment was performed by qualified clinical staff member and as supervising physician I was immediately available for consultation/collaboration. I have reviewed documentation and agree with assessment and plan.  Odalis Jordan, DO  

## 2023-02-19 NOTE — Patient Instructions (Addendum)
Patient will return in 79 to 72 hours for  PPD read as nurse visit.

## 2023-02-19 NOTE — Progress Notes (Signed)
   Established Patient Office Visit  Subjective   Patient ID: Aimee Garza, female    DOB: 06-25-1971  Age: 52 y.o. 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     HPI  PPD placement- nurse visit. 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   ROS    Objective:     BP 130/80   Pulse 89   Ht 5\' 5"  (1.651 m)   SpO2 100%   BMI 28.62 kg/m    Physical Exam   No results found for any visits on 02/19/23.    The 10-year ASCVD risk score (Arnett DK, et al., 2019) is: 3.3%    Assessment & Plan:  PPD placement- nurse visit.  Administerd 0.43ml Left forearm , ID . Bleb present as expected.. Patient tolerated injection well without complications. Patient will schedule nurse visit for PPD read in 48 - 72 hours.  Problem List Items Addressed This Visit   None   No follow-ups on file.    Rae Lips, LPN

## 2023-02-21 ENCOUNTER — Ambulatory Visit (INDEPENDENT_AMBULATORY_CARE_PROVIDER_SITE_OTHER): Payer: 59 | Admitting: Family Medicine

## 2023-02-21 ENCOUNTER — Ambulatory Visit (INDEPENDENT_AMBULATORY_CARE_PROVIDER_SITE_OTHER): Payer: 59

## 2023-02-21 ENCOUNTER — Encounter: Payer: Self-pay | Admitting: Family Medicine

## 2023-02-21 VITALS — BP 128/89 | HR 101 | Ht 65.0 in

## 2023-02-21 DIAGNOSIS — Z111 Encounter for screening for respiratory tuberculosis: Secondary | ICD-10-CM | POA: Diagnosis not present

## 2023-02-21 DIAGNOSIS — Z1231 Encounter for screening mammogram for malignant neoplasm of breast: Secondary | ICD-10-CM | POA: Diagnosis not present

## 2023-02-21 LAB — TB SKIN TEST
Induration: 0 mm
TB Skin Test: NEGATIVE

## 2023-02-21 NOTE — Progress Notes (Signed)
   Established Patient Office Visit  Subjective   Patient ID: Aimee Garza, female    DOB: [birth year]  Age: 52 y.o. MRN: [id number]  Chief Complaint  Patient presents with   PPD Reading    HPI  PPD read - nurse visit.   ROS    Objective:     BP 128/89   Pulse (!) 101   Ht 5\' 5"  (1.651 m)   SpO2 97%   BMI 28.62 kg/m    Physical Exam   No results found for any visits on 02/21/23.    The 10-year ASCVD risk score (Arnett DK, et al., 2019) is: 3.2%    Assessment & Plan:  PPD read - nurse visit . Negative result. 73mm induration.  Problem List Items Addressed This Visit   None   No follow-ups on file.    Rae Lips, LPN

## 2023-02-21 NOTE — Progress Notes (Signed)
Medical screening examination/treatment was performed by qualified clinical staff member and as supervising physician I was immediately available for consultation/collaboration. I have reviewed documentation and agree with assessment and plan.  Trace Cederberg, DO  

## 2023-05-01 ENCOUNTER — Ambulatory Visit: Payer: 59

## 2023-05-01 ENCOUNTER — Ambulatory Visit (INDEPENDENT_AMBULATORY_CARE_PROVIDER_SITE_OTHER): Payer: 59 | Admitting: Family Medicine

## 2023-05-01 DIAGNOSIS — Z111 Encounter for screening for respiratory tuberculosis: Secondary | ICD-10-CM | POA: Diagnosis not present

## 2023-05-01 NOTE — Progress Notes (Signed)
Medical screening examination/treatment was performed by qualified clinical staff member and as supervising physician I was immediately available for consultation/collaboration. I have reviewed documentation and agree with assessment and plan.  Karlisa Gaubert, DO  

## 2023-05-01 NOTE — Progress Notes (Signed)
   Established Patient Office Visit  Subjective   Patient ID: Aimee Garza, female    DOB: 1971-12-02  Age: 52 y.o. MRN: [id number]  Chief Complaint  Patient presents with   PPD Placement    HPI  Aimee Garza is here for PPD placement. She had one in March. However, two is required for her new position.   ROS    Objective:     LMP 09/28/2016    Physical Exam   No results found for any visits on 05/01/23.    The 10-year ASCVD risk score (Arnett DK, et al., 2019) is: 3.2%    Assessment & Plan:  PPD placement - Patient tolerated injection well without complications. Patient advised to schedule next injection 2-3 days from today.    Problem List Items Addressed This Visit   None Visit Diagnoses     Screening-pulmonary TB    -  Primary   Relevant Orders   PPD (Completed)       Return in about 2 days (around 05/03/2023) for PPD reading. Earna Coder, Janalyn Harder, CMA

## 2023-05-03 ENCOUNTER — Ambulatory Visit (INDEPENDENT_AMBULATORY_CARE_PROVIDER_SITE_OTHER): Payer: 59 | Admitting: Family Medicine

## 2023-05-03 ENCOUNTER — Encounter: Payer: Self-pay | Admitting: Family Medicine

## 2023-05-03 VITALS — BP 130/96 | HR 96

## 2023-05-03 DIAGNOSIS — Z111 Encounter for screening for respiratory tuberculosis: Secondary | ICD-10-CM | POA: Diagnosis not present

## 2023-05-03 LAB — TB SKIN TEST
Induration: 0 mm
TB Skin Test: NEGATIVE

## 2023-05-03 NOTE — Progress Notes (Signed)
   Established Patient Office Visit  Subjective   Patient ID: Aimee Garza, female    DOB: 1971-08-16  Age: 52 y.o. MRN: [id number]  Chief Complaint  Patient presents with   PPD Reading    HPI  Aimee Garza is here for PPD reading.   ROS    Objective:     BP (!) 142/96   Pulse 96   LMP 09/28/2016   SpO2 98%    Physical Exam   No results found for any visits on 05/03/23.    The 10-year ASCVD risk score (Arnett DK, et al., 2019) is: 3.9%    Assessment & Plan:  PPD - negative with 0 mm.    Problem List Items Addressed This Visit   None Visit Diagnoses     Screening-pulmonary TB    -  Primary       No follow-ups on file.    Esmond Harps, CMA

## 2023-05-03 NOTE — Progress Notes (Signed)
Medical screening examination/treatment was performed by qualified clinical staff member and as supervising physician I was immediately available for consultation/collaboration. I have reviewed documentation and agree with assessment and plan.  Teven Mittman, DO  

## 2023-05-05 ENCOUNTER — Other Ambulatory Visit: Payer: Self-pay | Admitting: Family Medicine

## 2023-08-03 ENCOUNTER — Other Ambulatory Visit: Payer: Self-pay | Admitting: Family Medicine

## 2023-10-28 ENCOUNTER — Other Ambulatory Visit: Payer: Self-pay | Admitting: Family Medicine

## 2023-10-28 DIAGNOSIS — F3176 Bipolar disorder, in full remission, most recent episode depressed: Secondary | ICD-10-CM

## 2023-10-29 NOTE — Telephone Encounter (Signed)
Pls schedule pt for appt with Dr. Ashley Royalty for medication refills. Last seem 11/2022. Thanks

## 2023-10-29 NOTE — Telephone Encounter (Signed)
Spoke with patient, patient currently doesn't have insurance, she states that she will schedule appt once she has insurance, thanks.

## 2023-12-02 ENCOUNTER — Other Ambulatory Visit: Payer: Self-pay | Admitting: Family Medicine

## 2023-12-02 DIAGNOSIS — F3176 Bipolar disorder, in full remission, most recent episode depressed: Secondary | ICD-10-CM

## 2023-12-03 NOTE — Telephone Encounter (Signed)
Pls contact the patient to schedule appt with Dr. Ashley Royalty for mood meds. Unable to refill meds. Last seen 06/19/2022

## 2023-12-03 NOTE — Telephone Encounter (Signed)
Called patient left voicemail to call back and schedule an appointment for mood meds

## 2023-12-11 ENCOUNTER — Other Ambulatory Visit: Payer: Self-pay | Admitting: Family Medicine

## 2023-12-11 DIAGNOSIS — F3176 Bipolar disorder, in full remission, most recent episode depressed: Secondary | ICD-10-CM

## 2023-12-12 NOTE — Telephone Encounter (Signed)
 Pls contact patient to schedule appt with Dr. Ashley Royalty for annual and med refills. Thanks

## 2023-12-31 ENCOUNTER — Other Ambulatory Visit: Payer: Self-pay | Admitting: Family Medicine

## 2023-12-31 ENCOUNTER — Encounter: Payer: Self-pay | Admitting: Family Medicine

## 2023-12-31 ENCOUNTER — Ambulatory Visit (INDEPENDENT_AMBULATORY_CARE_PROVIDER_SITE_OTHER): Payer: BC Managed Care – PPO | Admitting: Family Medicine

## 2023-12-31 VITALS — BP 120/73 | HR 84 | Ht 65.0 in | Wt 175.0 lb

## 2023-12-31 DIAGNOSIS — Z0000 Encounter for general adult medical examination without abnormal findings: Secondary | ICD-10-CM | POA: Diagnosis not present

## 2023-12-31 DIAGNOSIS — F3176 Bipolar disorder, in full remission, most recent episode depressed: Secondary | ICD-10-CM

## 2023-12-31 DIAGNOSIS — E785 Hyperlipidemia, unspecified: Secondary | ICD-10-CM | POA: Diagnosis not present

## 2023-12-31 DIAGNOSIS — F17210 Nicotine dependence, cigarettes, uncomplicated: Secondary | ICD-10-CM

## 2023-12-31 DIAGNOSIS — F17200 Nicotine dependence, unspecified, uncomplicated: Secondary | ICD-10-CM

## 2023-12-31 DIAGNOSIS — Z124 Encounter for screening for malignant neoplasm of cervix: Secondary | ICD-10-CM

## 2023-12-31 DIAGNOSIS — Z1159 Encounter for screening for other viral diseases: Secondary | ICD-10-CM | POA: Diagnosis not present

## 2023-12-31 DIAGNOSIS — Z1231 Encounter for screening mammogram for malignant neoplasm of breast: Secondary | ICD-10-CM

## 2023-12-31 DIAGNOSIS — M51369 Other intervertebral disc degeneration, lumbar region without mention of lumbar back pain or lower extremity pain: Secondary | ICD-10-CM

## 2023-12-31 MED ORDER — QUETIAPINE FUMARATE ER 200 MG PO TB24: 200.0000 mg | ORAL_TABLET | Freq: Every day | ORAL | 1 refills | Status: DC

## 2023-12-31 MED ORDER — ALBUTEROL SULFATE HFA 108 (90 BASE) MCG/ACT IN AERS: 2.0000 | INHALATION_SPRAY | Freq: Four times a day (QID) | RESPIRATORY_TRACT | 0 refills | Status: DC | PRN

## 2023-12-31 MED ORDER — TRAMADOL HCL 50 MG PO TABS: 50.0000 mg | ORAL_TABLET | Freq: Four times a day (QID) | ORAL | 1 refills | Status: DC | PRN

## 2023-12-31 MED ORDER — QUETIAPINE FUMARATE 50 MG PO TABS: ORAL_TABLET | ORAL | 1 refills | Status: DC

## 2023-12-31 MED ORDER — GABAPENTIN 100 MG PO CAPS: ORAL_CAPSULE | ORAL | 0 refills | Status: DC

## 2023-12-31 MED ORDER — CYCLOBENZAPRINE HCL 10 MG PO TABS: ORAL_TABLET | ORAL | 0 refills | Status: DC

## 2023-12-31 MED ORDER — TOPIRAMATE 50 MG PO TABS: 50.0000 mg | ORAL_TABLET | Freq: Two times a day (BID) | ORAL | 1 refills | Status: DC

## 2023-12-31 MED ORDER — VENLAFAXINE HCL ER 75 MG PO CP24: 75.0000 mg | ORAL_CAPSULE | Freq: Every day | ORAL | 3 refills | Status: DC

## 2023-12-31 NOTE — Assessment & Plan Note (Signed)
She has used flexeril and tramadol in the past for chronic back pain flares.  Updated rx sent.

## 2023-12-31 NOTE — Assessment & Plan Note (Signed)
Recommend cessation.  She has 30 pack year history.  Referral for lung cancer screening.

## 2023-12-31 NOTE — Assessment & Plan Note (Signed)
Well adult Orders Placed This Encounter  Procedures   Hepatitis C Antibody   CMP14+EGFR   CBC with Differential/Platelet   Lipid Panel With LDL/HDL Ratio   Ambulatory referral to Obstetrics / Gynecology    Referral Priority:   Routine    Referral Type:   Consultation    Referral Reason:   Specialty Services Required    Requested Specialty:   Obstetrics and Gynecology    Number of Visits Requested:   1   Ambulatory Referral Lung Cancer Screening [facility]    Referral Priority:   Routine    Referral Type:   Consultation    Referral Reason:   Specialty Services Required    Number of Visits Requested:   1  Screenings: per lab orders Immunizations:  Declines Anticipatory guidance/Risk factor reduction:  Recommendations per AVS.

## 2023-12-31 NOTE — Progress Notes (Signed)
Aimee Garza - 53 y.o. female MRN [id number]  Date of birth: 16-May-1971  Subjective Chief Complaint  Patient presents with   Annual Exam    HPI Aimee Garza is a 53 y.o. female here today for annual exam.   She reports that she is doing well.  Current medications are working well for her.  She needs renewals of these.  She is also requesting tramadol and flexeril which she has used ffor back pain flare ups in the past.      She is moderately active and does yoga at home.  She feels that her diet is pretty good.   She smokes about 1ppd for the past 30 years.  Not ready to quit yet.  Occasional EtOH use. She is interested in lung cancer screening.    Review of Systems  Constitutional:  Negative for chills, fever, malaise/fatigue and weight loss.  HENT:  Negative for congestion, ear pain and sore throat.   Eyes:  Negative for blurred vision, double vision and pain.  Respiratory:  Negative for cough and shortness of breath.   Cardiovascular:  Negative for chest pain and palpitations.  Gastrointestinal:  Negative for abdominal pain, blood in stool, constipation, heartburn and nausea.  Genitourinary:  Negative for dysuria and urgency.  Musculoskeletal:  Negative for joint pain and myalgias.  Neurological:  Negative for dizziness and headaches.  Endo/Heme/Allergies:  Does not bruise/bleed easily.  Psychiatric/Behavioral:  Negative for depression. The patient is not nervous/anxious and does not have insomnia.       No Known Allergies  Past Medical History:  Diagnosis Date   Bipolar disorder (HCC) 03/23/2014   Chronic low back pain 01/10/2018   Sleep apnea    mild-no CPAP    Past Surgical History:  Procedure Laterality Date   back injection     back nerve burnt     TUBAL LIGATION     WISDOM TOOTH EXTRACTION      Social History   Socioeconomic History   Marital status: Married    Spouse name: Not on file   Number of children: Not on file   Years of education: Not  on file   Highest education level: Not on file  Occupational History   Not on file  Tobacco Use   Smoking status: Every Day    Current packs/day: 1.00    Average packs/day: 1 pack/day for 26.0 years (26.0 ttl pk-yrs)    Types: Cigarettes   Smokeless tobacco: Never  Vaping Use   Vaping status: Never Used  Substance and Sexual Activity   Alcohol use: Yes    Alcohol/week: 2.0 standard drinks of alcohol    Types: 2 Standard drinks or equivalent per week   Drug use: No   Sexual activity: Not Currently    Partners: Male  Other Topics Concern   Not on file  Social History Narrative   Not on file   Social Drivers of Health   Financial Resource Strain: Not on file  Food Insecurity: Not on file  Transportation Needs: Not on file  Physical Activity: Inactive (06/04/2018)   Exercise Vital Sign    Days of Exercise per Week: 0 days    Minutes of Exercise per Session: 0 min  Stress: Not on file  Social Connections: Not on file    Family History  Problem Relation Age of Onset   Breast cancer Mother    Alcoholism Other        grandfather   Diabetes Other  grandmother   Hypertension Other        grandparents   Colon cancer Neg Hx    Colon polyps Neg Hx    Esophageal cancer Neg Hx    Rectal cancer Neg Hx    Stomach cancer Neg Hx     Health Maintenance  Topic Date Due   Hepatitis C Screening  Never done   Cervical Cancer Screening (HPV/Pap Cotest)  04/24/2022   Lung Cancer Screening  Never done   Pneumococcal Vaccine 93-61 Years old (1 of 2 - PCV) 12/30/2024 (Originally 12/01/1978)   COVID-19 Vaccine (3 - Moderna risk series) 01/15/2025 (Originally 07/06/2020)   Zoster Vaccines- Shingrix (1 of 2) 03/30/2025 (Originally 12/02/1991)   MAMMOGRAM  02/20/2025   DTaP/Tdap/Td (3 - Td or Tdap) 03/21/2026   Colonoscopy  08/06/2031   INFLUENZA VACCINE  Completed   HIV Screening  Completed   HPV VACCINES  Aged Out      ----------------------------------------------------------------------------------------------------------------------------------------------------------------------------------------------------------------- Physical Exam BP 120/73 (BP Location: Left Arm, Patient Position: Sitting, Cuff Size: Normal)   Pulse 84   Ht 5\' 5"  (1.651 m)   Wt 175 lb (79.4 kg)   LMP 09/28/2016   SpO2 98%   BMI 29.12 kg/m   Physical Exam Constitutional:      General: She is not in acute distress. HENT:     Head: Normocephalic and atraumatic.     Right Ear: Tympanic membrane and ear canal normal.     Left Ear: Tympanic membrane and ear canal normal.     Nose: Nose normal.  Eyes:     General: No scleral icterus.    Conjunctiva/sclera: Conjunctivae normal.  Neck:     Thyroid: No thyromegaly.  Cardiovascular:     Rate and Rhythm: Normal rate and regular rhythm.     Heart sounds: Normal heart sounds.  Pulmonary:     Effort: Pulmonary effort is normal.     Breath sounds: Normal breath sounds.  Abdominal:     General: Bowel sounds are normal. There is no distension.     Palpations: Abdomen is soft.     Tenderness: There is no abdominal tenderness. There is no guarding.  Musculoskeletal:        General: Normal range of motion.     Cervical back: Normal range of motion and neck supple.  Lymphadenopathy:     Cervical: No cervical adenopathy.  Skin:    General: Skin is warm and dry.     Findings: No rash.  Neurological:     General: No focal deficit present.     Mental Status: She is alert and oriented to person, place, and time.     Cranial Nerves: No cranial nerve deficit.     Coordination: Coordination normal.  Psychiatric:        Mood and Affect: Mood normal.        Behavior: Behavior normal.      ------------------------------------------------------------------------------------------------------------------------------------------------------------------------------------------------------------------- Assessment and Plan  Degeneration of lumbar intervertebral disc She has used flexeril and tramadol in the past for chronic back pain flares.  Updated rx sent.   Smoker Recommend cessation.  She has 30 pack year history.  Referral for lung cancer screening.   Bipolar disorder Stable with current medications.  Recommend continuation.    Well adult exam Well adult Orders Placed This Encounter  Procedures   Hepatitis C Antibody   CMP14+EGFR   CBC with Differential/Platelet   Lipid Panel With LDL/HDL Ratio   Ambulatory referral to Obstetrics / Gynecology  Referral Priority:   Routine    Referral Type:   Consultation    Referral Reason:   Specialty Services Required    Requested Specialty:   Obstetrics and Gynecology    Number of Visits Requested:   1   Ambulatory Referral Lung Cancer Screening [facility]    Referral Priority:   Routine    Referral Type:   Consultation    Referral Reason:   Specialty Services Required    Number of Visits Requested:   1  Screenings: per lab orders Immunizations:  Declines Anticipatory guidance/Risk factor reduction:  Recommendations per AVS.   Meds ordered this encounter  Medications   QUEtiapine (SEROQUEL XR) 200 MG 24 hr tablet    Sig: Take 1 tablet (200 mg total) by mouth at bedtime.    Dispense:  90 tablet    Refill:  1   QUEtiapine (SEROQUEL) 50 MG tablet    Sig: TAKE 1 TABLET (50 MG TOTAL) AT BEDTIME BY MOUTH. TAKE IN ADDITION TO EXTENDED RELEASE QUETIAPINE.    Dispense:  90 tablet    Refill:  1   gabapentin (NEURONTIN) 100 MG capsule    Sig: TAKE ONE CAPSULE BY MOUTH 3 TIMES A DAY AS NEEDED FOR PAIN    Dispense:  270 capsule    Refill:  0   traMADol (ULTRAM) 50 MG tablet    Sig: Take 1 tablet (50 mg total) by  mouth every 6 (six) hours as needed. for pain    Dispense:  60 tablet    Refill:  1    Not to exceed 2 additional fills before 04/29/2023   topiramate (TOPAMAX) 50 MG tablet    Sig: Take 1 tablet (50 mg total) by mouth 2 (two) times daily.    Dispense:  180 tablet    Refill:  1   venlafaxine XR (EFFEXOR-XR) 75 MG 24 hr capsule    Sig: Take 1 capsule (75 mg total) by mouth daily with breakfast.    Dispense:  90 capsule    Refill:  3   albuterol (VENTOLIN HFA) 108 (90 Base) MCG/ACT inhaler    Sig: Inhale 2 puffs into the lungs every 6 (six) hours as needed for wheezing or shortness of breath.    Dispense:  18 each    Refill:  0   cyclobenzaprine (FLEXERIL) 10 MG tablet    Sig: TAKE 0.5 TO 1 TABLET BY MOUTH 3 TIMES A DAY AS NEEDED FOR MUSCLE SPASM    Dispense:  60 tablet    Refill:  0    No follow-ups on file.    This visit occurred during the SARS-CoV-2 public health emergency.  Safety protocols were in place, including screening questions prior to the visit, additional usage of staff PPE, and extensive cleaning of exam room while observing appropriate contact time as indicated for disinfecting solutions.

## 2023-12-31 NOTE — Assessment & Plan Note (Signed)
Stable with current medications.  Recommend continuation.

## 2023-12-31 NOTE — Patient Instructions (Signed)
Preventive Care 48-52 Years Old, Female  Preventive care refers to lifestyle choices and visits with your health care provider that can promote health and wellness. Preventive care visits are also called wellness exams.  What can I expect for my preventive care visit?  Counseling  Your health care provider may ask you questions about your:  Medical history, including:  Past medical problems.  Family medical history.  Pregnancy history.  Current health, including:  Menstrual cycle.  Method of birth control.  Emotional well-being.  Home life and relationship well-being.  Sexual activity and sexual health.  Lifestyle, including:  Alcohol, nicotine or tobacco, and drug use.  Access to firearms.  Diet, exercise, and sleep habits.  Work and work Astronomer.  Sunscreen use.  Safety issues such as seatbelt and bike helmet use.  Physical exam  Your health care provider will check your:  Height and weight. These may be used to calculate your BMI (body mass index). BMI is a measurement that tells if you are at a healthy weight.  Waist circumference. This measures the distance around your waistline. This measurement also tells if you are at a healthy weight and may help predict your risk of certain diseases, such as type 2 diabetes and high blood pressure.  Heart rate and blood pressure.  Body temperature.  Skin for abnormal spots.  What immunizations do I need?    Vaccines are usually given at various ages, according to a schedule. Your health care provider will recommend vaccines for you based on your age, medical history, and lifestyle or other factors, such as travel or where you work.  What tests do I need?  Screening  Your health care provider may recommend screening tests for certain conditions. This may include:  Lipid and cholesterol levels.  Diabetes screening. This is done by checking your blood sugar (glucose) after you have not eaten for a while (fasting).  Pelvic exam and Pap test.  Hepatitis B test.  Hepatitis C  test.  HIV (human immunodeficiency virus) test.  STI (sexually transmitted infection) testing, if you are at risk.  Lung cancer screening.  Colorectal cancer screening.  Mammogram. Talk with your health care provider about when you should start having regular mammograms. This may depend on whether you have a family history of breast cancer.  BRCA-related cancer screening. This may be done if you have a family history of breast, ovarian, tubal, or peritoneal cancers.  Bone density scan. This is done to screen for osteoporosis.  Talk with your health care provider about your test results, treatment options, and if necessary, the need for more tests.  Follow these instructions at home:  Eating and drinking    Eat a diet that includes fresh fruits and vegetables, whole grains, lean protein, and low-fat dairy products.  Take vitamin and mineral supplements as recommended by your health care provider.  Do not drink alcohol if:  Your health care provider tells you not to drink.  You are pregnant, may be pregnant, or are planning to become pregnant.  If you drink alcohol:  Limit how much you have to 0-1 drink a day.  Know how much alcohol is in your drink. In the [place] one drink equals one 12 oz bottle of beer (355 mL), one 5 oz glass of wine (148 mL), or one 1 oz glass of hard liquor (44 mL).  Lifestyle  Brush your teeth every morning and night with fluoride toothpaste. Floss one time each day.  Exercise for at least  30 minutes 5 or more days each week.  Do not use any products that contain nicotine or tobacco. These products include cigarettes, chewing tobacco, and vaping devices, such as e-cigarettes. If you need help quitting, ask your health care provider.  Do not use drugs.  If you are sexually active, practice safe sex. Use a condom or other form of protection to prevent STIs.  If you do not wish to become pregnant, use a form of birth control. If you plan to become pregnant, see your health care provider for a  prepregnancy visit.  Take aspirin only as told by your health care provider. Make sure that you understand how much to take and what form to take. Work with your health care provider to find out whether it is safe and beneficial for you to take aspirin daily.  Find healthy ways to manage stress, such as:  Meditation, yoga, or listening to music.  Journaling.  Talking to a trusted person.  Spending time with friends and family.  Minimize exposure to UV radiation to reduce your risk of skin cancer.  Safety  Always wear your seat belt while driving or riding in a vehicle.  Do not drive:  If you have been drinking alcohol. Do not ride with someone who has been drinking.  When you are tired or distracted.  While texting.  If you have been using any mind-altering substances or drugs.  Wear a helmet and other protective equipment during sports activities.  If you have firearms in your house, make sure you follow all gun safety procedures.  Seek help if you have been physically or sexually abused.  What's next?  Visit your health care provider once a year for an annual wellness visit.  Ask your health care provider how often you should have your eyes and teeth checked.  Stay up to date on all vaccines.  This information is not intended to replace advice given to you by your health care provider. Make sure you discuss any questions you have with your health care provider.  Document Revised: 05/18/2021 Document Reviewed: 05/18/2021  Elsevier Patient Education  2024 [employer].

## 2024-01-01 LAB — CMP14+EGFR
ALT: 14 [IU]/L (ref 0–32)
AST: 16 [IU]/L (ref 0–40)
Albumin: 4.1 g/dL (ref 3.8–4.9)
Alkaline Phosphatase: 120 [IU]/L (ref 44–121)
BUN/Creatinine Ratio: 18 (ref 9–23)
BUN: 13 mg/dL (ref 6–24)
Bilirubin Total: 0.4 mg/dL (ref 0.0–1.2)
CO2: 23 mmol/L (ref 20–29)
Calcium: 9.3 mg/dL (ref 8.7–10.2)
Chloride: 103 mmol/L (ref 96–106)
Creatinine, Ser: 0.71 mg/dL (ref 0.57–1.00)
Globulin, Total: 2.1 g/dL (ref 1.5–4.5)
Glucose: 87 mg/dL (ref 70–99)
Potassium: 4.5 mmol/L (ref 3.5–5.2)
Sodium: 140 mmol/L (ref 134–144)
Total Protein: 6.2 g/dL (ref 6.0–8.5)
eGFR: 103 mL/min/{1.73_m2} (ref >59)

## 2024-01-01 LAB — CBC WITH DIFFERENTIAL/PLATELET
Basophils Absolute: 0 10*3/uL (ref 0.0–0.2)
Basos: 0 %
EOS (ABSOLUTE): 0.2 10*3/uL (ref 0.0–0.4)
Eos: 4 %
Hematocrit: 41.7 % (ref 34.0–46.6)
Hemoglobin: 13.8 g/dL (ref 11.1–15.9)
Immature Grans (Abs): 0 10*3/uL (ref 0.0–0.1)
Immature Granulocytes: 0 %
Lymphocytes Absolute: 2 10*3/uL (ref 0.7–3.1)
Lymphs: 33 %
MCH: 30.3 pg (ref 26.6–33.0)
MCHC: 33.1 g/dL (ref 31.5–35.7)
MCV: 91 fL (ref 79–97)
Monocytes Absolute: 0.4 10*3/uL (ref 0.1–0.9)
Monocytes: 6 %
Neutrophils Absolute: 3.4 10*3/uL (ref 1.4–7.0)
Neutrophils: 57 %
Platelets: 333 10*3/uL (ref 150–450)
RBC: 4.56 x10E6/uL (ref 3.77–5.28)
RDW: 12.7 % (ref 11.7–15.4)
WBC: 6 10*3/uL (ref 3.4–10.8)

## 2024-01-01 LAB — HEPATITIS C ANTIBODY: Hep C Virus Ab: NONREACTIVE

## 2024-01-01 LAB — LIPID PANEL WITH LDL/HDL RATIO
Cholesterol, Total: 209 mg/dL — ABNORMAL HIGH (ref 100–199)
HDL: 56 mg/dL (ref >39)
LDL Chol Calc (NIH): 136 mg/dL — ABNORMAL HIGH (ref 0–99)
LDL/HDL Ratio: 2.4 {ratio} (ref 0.0–3.2)
Triglycerides: 93 mg/dL (ref 0–149)
VLDL Cholesterol Cal: 17 mg/dL (ref 5–40)

## 2024-01-11 ENCOUNTER — Encounter: Payer: Self-pay | Admitting: Family Medicine

## 2024-01-19 ENCOUNTER — Other Ambulatory Visit: Payer: Self-pay | Admitting: Family Medicine

## 2024-01-25 ENCOUNTER — Other Ambulatory Visit: Payer: Self-pay | Admitting: Family Medicine

## 2024-02-15 ENCOUNTER — Telehealth: Payer: Self-pay | Admitting: Acute Care

## 2024-02-15 ENCOUNTER — Other Ambulatory Visit: Payer: Self-pay

## 2024-02-15 DIAGNOSIS — Z87891 Personal history of nicotine dependence: Secondary | ICD-10-CM

## 2024-02-15 DIAGNOSIS — F17210 Nicotine dependence, cigarettes, uncomplicated: Secondary | ICD-10-CM

## 2024-02-15 DIAGNOSIS — Z122 Encounter for screening for malignant neoplasm of respiratory organs: Secondary | ICD-10-CM

## 2024-02-15 NOTE — Telephone Encounter (Signed)
 Lung Cancer Screening Narrative/Criteria Questionnaire (Cigarette Smokers Only- No Cigars/Pipes/vapes)   Charm Barges   SDMV:03/14/24 at [facility]                                           05-26-1971              LDCT: 03/20/24 at 2pm / [facility]    52 y.o.   Phone: [phone]  Lung Screening Narrative (confirm age 68-77 yrs Medicare / 50-80 yrs Private pay insurance)   Insurance information:BCBS   Referring Provider:[facility]   This screening involves an initial phone call with a team member from our program. It is called a shared decision making visit. The initial meeting is required by insurance and Medicare to make sure you understand the program. This appointment takes about 15-20 minutes to complete. The CT scan will completed at a separate date/time. This scan takes about 5-10 minutes to complete and you may eat and drink before and after the scan.  Criteria questions for Lung Cancer Screening:   Are you a current or former smoker? Current Age began smoking: 53 yo   If you are a former smoker, what year did you quit smoking? Quit once for 1 year   To calculate your smoking history, I need an accurate estimate of how many packs of cigarettes you smoked per day and for how many years. (Not just the number of PPD you are now smoking)   Years smoking 36 x Packs per day 1 = Pack years 36   (at least 20 pack yrs)   (Make sure they understand that we need to know how much they have smoked in the past, not just the number of PPD they are smoking now)  Do you have a personal history of cancer?  No    Do you have a family history of cancer? Yes  (cancer type and and relative) mother/breast  Are you coughing up blood?  No  Have you had unexplained weight loss of 15 lbs or more in the last 6 months? No  It looks like you meet all criteria.     Additional information: N/A

## 2024-02-16 ENCOUNTER — Encounter: Payer: Self-pay | Admitting: Family Medicine

## 2024-02-20 NOTE — Telephone Encounter (Signed)
Attempted call to patient. Mail box full. Could not leave a voice mail message.  

## 2024-02-23 ENCOUNTER — Other Ambulatory Visit: Payer: Self-pay | Admitting: Family Medicine

## 2024-02-25 NOTE — Telephone Encounter (Signed)
Attempted call to patient . Mail box full could not leave a voice mail message.  

## 2024-02-27 ENCOUNTER — Ambulatory Visit (INDEPENDENT_AMBULATORY_CARE_PROVIDER_SITE_OTHER): Payer: BC Managed Care – PPO

## 2024-02-27 DIAGNOSIS — Z1231 Encounter for screening mammogram for malignant neoplasm of breast: Secondary | ICD-10-CM

## 2024-02-28 ENCOUNTER — Other Ambulatory Visit: Payer: Self-pay | Admitting: Family Medicine

## 2024-03-14 ENCOUNTER — Encounter: Admitting: Acute Care

## 2024-03-20 ENCOUNTER — Ambulatory Visit

## 2024-03-23 ENCOUNTER — Other Ambulatory Visit: Payer: Self-pay | Admitting: Family Medicine

## 2024-03-27 ENCOUNTER — Other Ambulatory Visit: Payer: Self-pay | Admitting: Family Medicine

## 2024-03-27 ENCOUNTER — Encounter: Payer: Self-pay | Admitting: *Deleted

## 2024-04-24 ENCOUNTER — Other Ambulatory Visit: Payer: Self-pay | Admitting: Family Medicine

## 2024-05-23 ENCOUNTER — Other Ambulatory Visit: Payer: Self-pay | Admitting: Family Medicine

## 2024-06-29 ENCOUNTER — Other Ambulatory Visit: Payer: Self-pay | Admitting: Family Medicine

## 2024-06-29 DIAGNOSIS — F3176 Bipolar disorder, in full remission, most recent episode depressed: Secondary | ICD-10-CM

## 2024-06-30 ENCOUNTER — Other Ambulatory Visit: Payer: Self-pay | Admitting: Family Medicine

## 2024-06-30 NOTE — Telephone Encounter (Signed)
 Please advise on refill request

## 2024-07-24 ENCOUNTER — Other Ambulatory Visit: Payer: Self-pay | Admitting: Family Medicine

## 2024-07-26 ENCOUNTER — Other Ambulatory Visit: Payer: Self-pay | Admitting: Family Medicine

## 2024-07-30 ENCOUNTER — Other Ambulatory Visit: Payer: Self-pay | Admitting: Family Medicine

## 2024-07-30 DIAGNOSIS — F3176 Bipolar disorder, in full remission, most recent episode depressed: Secondary | ICD-10-CM

## 2024-08-05 ENCOUNTER — Encounter: Payer: Self-pay | Admitting: Sports Medicine

## 2024-08-15 ENCOUNTER — Other Ambulatory Visit: Payer: Self-pay | Admitting: Family Medicine

## 2024-08-23 ENCOUNTER — Other Ambulatory Visit: Payer: Self-pay | Admitting: Family Medicine

## 2024-08-31 ENCOUNTER — Other Ambulatory Visit: Payer: Self-pay | Admitting: Family Medicine

## 2024-09-01 ENCOUNTER — Ambulatory Visit: Admitting: Family Medicine

## 2024-09-01 ENCOUNTER — Encounter: Payer: Self-pay | Admitting: Family Medicine

## 2024-09-01 VITALS — BP 137/90 | HR 87 | Ht 65.0 in | Wt 175.0 lb

## 2024-09-01 DIAGNOSIS — J329 Chronic sinusitis, unspecified: Secondary | ICD-10-CM | POA: Diagnosis not present

## 2024-09-01 DIAGNOSIS — G8929 Other chronic pain: Secondary | ICD-10-CM

## 2024-09-01 DIAGNOSIS — F3176 Bipolar disorder, in full remission, most recent episode depressed: Secondary | ICD-10-CM | POA: Diagnosis not present

## 2024-09-01 DIAGNOSIS — M545 Low back pain, unspecified: Secondary | ICD-10-CM

## 2024-09-01 DIAGNOSIS — Z124 Encounter for screening for malignant neoplasm of cervix: Secondary | ICD-10-CM | POA: Diagnosis not present

## 2024-09-01 DIAGNOSIS — J40 Bronchitis, not specified as acute or chronic: Secondary | ICD-10-CM

## 2024-09-01 MED ORDER — DOXYCYCLINE HYCLATE 100 MG PO TABS: 100.0000 mg | ORAL_TABLET | Freq: Two times a day (BID) | ORAL | 0 refills | Status: AC

## 2024-09-01 MED ORDER — ALBUTEROL SULFATE HFA 108 (90 BASE) MCG/ACT IN AERS: 2.0000 | INHALATION_SPRAY | RESPIRATORY_TRACT | 2 refills | Status: AC | PRN

## 2024-09-01 MED ORDER — TOPIRAMATE 50 MG PO TABS: 50.0000 mg | ORAL_TABLET | Freq: Two times a day (BID) | ORAL | 1 refills | Status: AC

## 2024-09-01 MED ORDER — PREDNISONE 20 MG PO TABS: 20.0000 mg | ORAL_TABLET | Freq: Two times a day (BID) | ORAL | 0 refills | Status: AC

## 2024-09-01 MED ORDER — HYDROCODONE BIT-HOMATROP MBR 5-1.5 MG/5ML PO SOLN: 5.0000 mL | Freq: Four times a day (QID) | ORAL | 0 refills | Status: AC | PRN

## 2024-09-01 MED ORDER — QUETIAPINE FUMARATE 50 MG PO TABS: ORAL_TABLET | ORAL | 2 refills | Status: AC

## 2024-09-01 MED ORDER — QUETIAPINE FUMARATE ER 200 MG PO TB24: 200.0000 mg | ORAL_TABLET | Freq: Every day | ORAL | 5 refills | Status: AC

## 2024-09-01 MED ORDER — VENLAFAXINE HCL ER 75 MG PO CP24: 75.0000 mg | ORAL_CAPSULE | Freq: Every day | ORAL | 0 refills | Status: DC

## 2024-09-01 NOTE — Assessment & Plan Note (Signed)
 Treating aggressively with combination of doxycyline and prednisone . Encouraged supportive care with rest and increased fluids.  Red lags and reasons to return to clinic discussed.

## 2024-09-01 NOTE — Assessment & Plan Note (Signed)
She may continue gabapentin 3 times daily with tramadol as needed.

## 2024-09-01 NOTE — Progress Notes (Signed)
 Aimee Garza - 53 y.o. female MRN [id number]  Date of birth: Jul 29, 1971  Subjective Chief Complaint  Patient presents with   Cough    HPI Aimee Garza is a 53 y.o. female here today for follow up visit.   She has had a cough for about 2 weeks.  Cough is productive and she has had wheezing as well.  She has tried several OTC medications without relief.  She did have fever initially but this has resolved.  Negative for COVID at home. She is a daily smoker.   She continues on seroquel , topiramate  and effexor  for management of bipolar d/o.  She is doing well with current medications.  She denies side effects at current strength.    ROS:  A comprehensive ROS was completed and negative except as noted per HPI  No Known Allergies  Past Medical History:  Diagnosis Date   Bipolar disorder (HCC) 03/23/2014   Chronic low back pain 01/10/2018   Sleep apnea    mild-no CPAP    Past Surgical History:  Procedure Laterality Date   back injection     back nerve burnt     TUBAL LIGATION     WISDOM TOOTH EXTRACTION      Social History   Socioeconomic History   Marital status: Married    Spouse name: Not on file   Number of children: Not on file   Years of education: Not on file   Highest education level: Not on file  Occupational History   Not on file  Tobacco Use   Smoking status: Every Day    Current packs/day: 1.00    Average packs/day: 1 pack/day for 26.0 years (26.0 ttl pk-yrs)    Types: Cigarettes   Smokeless tobacco: Never  Vaping Use   Vaping status: Never Used  Substance and Sexual Activity   Alcohol use: Yes    Alcohol/week: 2.0 standard drinks of alcohol    Types: 2 Standard drinks or equivalent per week   Drug use: No   Sexual activity: Not Currently    Partners: Male  Other Topics Concern   Not on file  Social History Narrative   Not on file   Social Drivers of Health   Financial Resource Strain: Low Risk  (09/01/2024)   Overall Financial Resource Strain  (CARDIA)    Difficulty of Paying Living Expenses: Not hard at all  Food Insecurity: No Food Insecurity (09/01/2024)   Hunger Vital Sign    Worried About Running Out of Food in the Last Year: Never true    Ran Out of Food in the Last Year: Never true  Transportation Needs: No Transportation Needs (09/01/2024)   PRAPARE - Administrator, Civil Service (Medical): No    Lack of Transportation (Non-Medical): No  Physical Activity: Sufficiently Active (09/01/2024)   Exercise Vital Sign    Days of Exercise per Week: 5 days    Minutes of Exercise per Session: 60 min  Stress: No Stress Concern Present (09/01/2024)   [employer] of Occupational Health - Occupational Stress Questionnaire    Feeling of Stress: Not at all  Social Connections: Unknown (09/01/2024)   Social Connection and Isolation Panel    Frequency of Communication with Friends and Family: Three times a week    Frequency of Social Gatherings with Friends and Family: Once a week    Attends Religious Services: Not on file    Active Member of Clubs or Organizations: No    Attends Club or  Organization Meetings: Never    Marital Status: Married    Family History  Problem Relation Age of Onset   Breast cancer Mother    Alcoholism Other        grandfather   Diabetes Other        grandmother   Hypertension Other        grandparents   Colon cancer Neg Hx    Colon polyps Neg Hx    Esophageal cancer Neg Hx    Rectal cancer Neg Hx    Stomach cancer Neg Hx     Health Maintenance  Topic Date Due   Hepatitis B Vaccines 19-59 Average Risk (1 of 3 - 19+ 3-dose series) Never done   Cervical Cancer Screening (HPV/Pap Cotest)  04/24/2022   Lung Cancer Screening  Never done   COVID-19 Vaccine (3 - Moderna risk series) 01/15/2025 (Originally 07/06/2020)   Influenza Vaccine  03/03/2025 (Originally 07/04/2024)   Zoster Vaccines- Shingrix (1 of 2) 03/30/2025 (Originally 12/02/1991)   Pneumococcal Vaccine: 50+ Years (1 of 2 -  PCV) 09/01/2025 (Originally 12/02/1991)   Mammogram  02/26/2026   DTaP/Tdap/Td (3 - Td or Tdap) 03/21/2026   Colonoscopy  08/06/2031   Hepatitis C Screening  Completed   HIV Screening  Completed   HPV VACCINES  Aged Out   Meningococcal B Vaccine  Aged Out     ----------------------------------------------------------------------------------------------------------------------------------------------------------------------------------------------------------------- Physical Exam BP (!) 137/90 (BP Location: Left Arm, Patient Position: Sitting, Cuff Size: Normal)   Pulse 87   Ht 5' 5 (1.651 m)   Wt 175 lb (79.4 kg)   LMP 09/28/2016   SpO2 97%   BMI 29.12 kg/m   Physical Exam Constitutional:      Appearance: Normal appearance.  Eyes:     General: No scleral icterus. Cardiovascular:     Rate and Rhythm: Normal rate and regular rhythm.  Pulmonary:     Effort: Pulmonary effort is normal.     Breath sounds: Normal breath sounds.  Neurological:     General: No focal deficit present.     Mental Status: She is alert.  Psychiatric:        Mood and Affect: Mood normal.        Behavior: Behavior normal.     ------------------------------------------------------------------------------------------------------------------------------------------------------------------------------------------------------------------- Assessment and Plan  Bipolar disorder (HCC) Stable with current medications.  Recommend continuation.    Sinobronchitis Treating aggressively with combination of doxycyline and prednisone . Encouraged supportive care with rest and increased fluids.  Red lags and reasons to return to clinic discussed.  Chronic low back pain She may continue gabapentin  3 times daily with tramadol  as needed.   Meds ordered this encounter  Medications   doxycycline (VIBRA-TABS) 100 MG tablet    Sig: Take 1 tablet (100 mg total) by mouth 2 (two) times daily.    Dispense:  20 tablet     Refill:  0   predniSONE  (DELTASONE ) 20 MG tablet    Sig: Take 1 tablet (20 mg total) by mouth 2 (two) times daily with a meal.    Dispense:  10 tablet    Refill:  0   QUEtiapine  (SEROQUEL  XR) 200 MG 24 hr tablet    Sig: Take 1 tablet (200 mg total) by mouth at bedtime.    Dispense:  30 tablet    Refill:  5   QUEtiapine  (SEROQUEL ) 50 MG tablet    Sig: TAKE 1 TABLET (50 MG TOTAL) AT BEDTIME BY MOUTH. TAKE IN ADDITION TO EXTENDED RELEASE QUETIAPINE .  Dispense:  90 tablet    Refill:  2   topiramate  (TOPAMAX ) 50 MG tablet    Sig: Take 1 tablet (50 mg total) by mouth 2 (two) times daily.    Dispense:  180 tablet    Refill:  1   venlafaxine  XR (EFFEXOR -XR) 75 MG 24 hr capsule    Sig: Take 1 capsule (75 mg total) by mouth daily with breakfast.    Dispense:  7 capsule    Refill:  0   albuterol  (VENTOLIN  HFA) 108 (90 Base) MCG/ACT inhaler    Sig: Inhale 2 puffs into the lungs every 4 (four) hours as needed for wheezing or shortness of breath.    Dispense:  18 each    Refill:  2   HYDROcodone  bit-homatropine (HYDROMET) 5-1.5 MG/5ML syrup    Sig: Take 5 mLs by mouth every 6 (six) hours as needed for cough.    Dispense:  120 mL    Refill:  0    No follow-ups on file.

## 2024-09-01 NOTE — Assessment & Plan Note (Signed)
 Stable with current medications.  Recommend continuation.

## 2024-09-17 ENCOUNTER — Other Ambulatory Visit: Payer: Self-pay | Admitting: Family Medicine

## 2024-09-29 ENCOUNTER — Other Ambulatory Visit: Payer: Self-pay | Admitting: Family Medicine

## 2024-10-15 ENCOUNTER — Other Ambulatory Visit: Payer: Self-pay | Admitting: Family Medicine

## 2024-10-25 ENCOUNTER — Other Ambulatory Visit: Payer: Self-pay | Admitting: Family Medicine

## 2024-10-25 DIAGNOSIS — M545 Low back pain, unspecified: Secondary | ICD-10-CM

## 2024-11-14 ENCOUNTER — Other Ambulatory Visit: Payer: Self-pay | Admitting: Family Medicine

## 2024-12-08 ENCOUNTER — Other Ambulatory Visit: Payer: Self-pay | Admitting: Family Medicine
